# Patient Record
Sex: Female | Born: 1960
Health system: Southern US, Community
[De-identification: ages and names within clinical notes are randomized; demographics above are authoritative.]

## PROBLEM LIST (undated history)

## (undated) DIAGNOSIS — C50912 Malignant neoplasm of unspecified site of left female breast: Secondary | ICD-10-CM

## (undated) DIAGNOSIS — E119 Type 2 diabetes mellitus without complications: Secondary | ICD-10-CM

## (undated) DIAGNOSIS — K5792 Diverticulitis of intestine, part unspecified, without perforation or abscess without bleeding: Secondary | ICD-10-CM

## (undated) DIAGNOSIS — K635 Polyp of colon: Secondary | ICD-10-CM

## (undated) DIAGNOSIS — K659 Peritonitis, unspecified: Secondary | ICD-10-CM

## (undated) DIAGNOSIS — E785 Hyperlipidemia, unspecified: Secondary | ICD-10-CM

## (undated) DIAGNOSIS — Z8742 Personal history of other diseases of the female genital tract: Secondary | ICD-10-CM

## (undated) DIAGNOSIS — M1990 Unspecified osteoarthritis, unspecified site: Secondary | ICD-10-CM

## (undated) DIAGNOSIS — E039 Hypothyroidism, unspecified: Secondary | ICD-10-CM

## (undated) DIAGNOSIS — Z8619 Personal history of other infectious and parasitic diseases: Secondary | ICD-10-CM

## (undated) DIAGNOSIS — E0500 Thyrotoxicosis with diffuse goiter without thyrotoxic crisis or storm: Secondary | ICD-10-CM

## (undated) DIAGNOSIS — I10 Essential (primary) hypertension: Secondary | ICD-10-CM

## (undated) DIAGNOSIS — Z8719 Personal history of other diseases of the digestive system: Secondary | ICD-10-CM

## (undated) DIAGNOSIS — N9489 Other specified conditions associated with female genital organs and menstrual cycle: Secondary | ICD-10-CM

## (undated) HISTORY — DX: Personal history of other infectious and parasitic diseases: Z86.19

## (undated) HISTORY — PX: HERNIA REPAIR: SHX51

## (undated) HISTORY — DX: Personal history of other diseases of the digestive system: Z87.19

## (undated) HISTORY — PX: LAPAROSCOPIC SIGMOID COLECTOMY: SHX5928

## (undated) HISTORY — DX: Unspecified osteoarthritis, unspecified site: M19.90

## (undated) HISTORY — PX: TONSILLECTOMY: SUR1361

## (undated) HISTORY — DX: Personal history of other diseases of the female genital tract: Z87.42

## (undated) HISTORY — DX: Essential (primary) hypertension: I10

## (undated) HISTORY — DX: Hypothyroidism, unspecified: E03.9

## (undated) HISTORY — DX: Hyperlipidemia, unspecified: E78.5

## (undated) HISTORY — DX: Other specified conditions associated with female genital organs and menstrual cycle: N94.89

## (undated) HISTORY — DX: Peritonitis, unspecified: K65.9

## (undated) HISTORY — DX: Polyp of colon: K63.5

## (undated) HISTORY — DX: Diverticulitis of intestine, part unspecified, without perforation or abscess without bleeding: K57.92

---

## 2001-02-15 ENCOUNTER — Other Ambulatory Visit: Admission: RE | Admit: 2001-02-15 | Discharge: 2001-02-15 | Payer: Self-pay | Admitting: Internal Medicine

## 2002-02-06 ENCOUNTER — Emergency Department (HOSPITAL_COMMUNITY): Admission: EM | Admit: 2002-02-06 | Discharge: 2002-02-06 | Payer: Self-pay | Admitting: Emergency Medicine

## 2005-05-06 ENCOUNTER — Emergency Department (HOSPITAL_COMMUNITY): Admission: EM | Admit: 2005-05-06 | Discharge: 2005-05-06 | Payer: Self-pay | Admitting: Emergency Medicine

## 2006-02-28 ENCOUNTER — Encounter: Admission: RE | Admit: 2006-02-28 | Discharge: 2006-02-28 | Payer: Self-pay | Admitting: Gastroenterology

## 2006-10-17 HISTORY — PX: TOTAL KNEE ARTHROPLASTY: SHX125

## 2007-05-07 ENCOUNTER — Encounter: Payer: Self-pay | Admitting: Family Medicine

## 2007-07-13 ENCOUNTER — Inpatient Hospital Stay (HOSPITAL_COMMUNITY): Admission: RE | Admit: 2007-07-13 | Discharge: 2007-07-18 | Payer: Self-pay | Admitting: Orthopedic Surgery

## 2007-07-16 ENCOUNTER — Ambulatory Visit: Payer: Self-pay | Admitting: Physical Medicine & Rehabilitation

## 2008-03-14 ENCOUNTER — Encounter: Admission: RE | Admit: 2008-03-14 | Discharge: 2008-03-14 | Payer: Self-pay | Admitting: Gastroenterology

## 2009-06-03 ENCOUNTER — Encounter: Payer: Self-pay | Admitting: Family Medicine

## 2009-06-03 ENCOUNTER — Encounter (INDEPENDENT_AMBULATORY_CARE_PROVIDER_SITE_OTHER): Payer: Self-pay | Admitting: *Deleted

## 2009-06-03 LAB — CONVERTED CEMR LAB
ALT: 27 units/L
Albumin: 4.1 g/dL
BUN: 16 mg/dL
Calcium: 9.2 mg/dL
Cholesterol: 187 mg/dL
Creatinine, Ser: 0.62 mg/dL
Globulin: 3 g/dL
HCT: 36.1 %
HDL: 34 mg/dL
MCH: 26 pg
MCV: 77 fL
Potassium, serum: 4.2 mmol/L
Sodium, serum: 139 mmol/L
Triglycerides: 178 mg/dL
Vit D, 25-Hydroxy: 23.1 ng/mL

## 2009-06-29 ENCOUNTER — Ambulatory Visit: Payer: Self-pay | Admitting: Oncology

## 2009-06-30 ENCOUNTER — Ambulatory Visit: Admission: RE | Admit: 2009-06-30 | Discharge: 2009-09-23 | Payer: Self-pay | Admitting: Radiation Oncology

## 2009-07-13 ENCOUNTER — Ambulatory Visit: Payer: Self-pay | Admitting: Genetic Counselor

## 2009-07-15 ENCOUNTER — Encounter: Admission: RE | Admit: 2009-07-15 | Discharge: 2009-07-15 | Payer: Self-pay | Admitting: General Surgery

## 2009-07-17 HISTORY — PX: BREAST LUMPECTOMY WITH NEEDLE LOCALIZATION AND AXILLARY LYMPH NODE DISSECTION: SHX5758

## 2009-07-20 ENCOUNTER — Ambulatory Visit (HOSPITAL_BASED_OUTPATIENT_CLINIC_OR_DEPARTMENT_OTHER): Admission: RE | Admit: 2009-07-20 | Discharge: 2009-07-20 | Payer: Self-pay | Admitting: General Surgery

## 2009-07-20 ENCOUNTER — Encounter (INDEPENDENT_AMBULATORY_CARE_PROVIDER_SITE_OTHER): Payer: Self-pay | Admitting: General Surgery

## 2009-07-25 ENCOUNTER — Emergency Department (HOSPITAL_COMMUNITY): Admission: EM | Admit: 2009-07-25 | Discharge: 2009-07-25 | Payer: Self-pay | Admitting: Emergency Medicine

## 2009-07-29 ENCOUNTER — Ambulatory Visit: Payer: Self-pay | Admitting: Oncology

## 2009-07-29 LAB — CBC WITH DIFFERENTIAL/PLATELET
BASO%: 0.3 % (ref 0.0–2.0)
Basophils Absolute: 0 10*3/uL (ref 0.0–0.1)
Eosinophils Absolute: 0.2 10*3/uL (ref 0.0–0.5)
HCT: 35.4 % (ref 34.8–46.6)
HGB: 12.1 g/dL (ref 11.6–15.9)
LYMPH%: 21.6 % (ref 14.0–49.7)
MCHC: 34.1 g/dL (ref 31.5–36.0)
MONO#: 0.7 10*3/uL (ref 0.1–0.9)
NEUT%: 68.2 % (ref 38.4–76.8)
Platelets: 467 10*3/uL — ABNORMAL HIGH (ref 145–400)
WBC: 8.9 10*3/uL (ref 3.9–10.3)
lymph#: 1.9 10*3/uL (ref 0.9–3.3)

## 2009-07-30 LAB — COMPREHENSIVE METABOLIC PANEL
BUN: 14 mg/dL (ref 6–23)
CO2: 25 mEq/L (ref 19–32)
Calcium: 9.5 mg/dL (ref 8.4–10.5)
Chloride: 101 mEq/L (ref 96–112)
Creatinine, Ser: 0.72 mg/dL (ref 0.40–1.20)
Glucose, Bld: 169 mg/dL — ABNORMAL HIGH (ref 70–99)
Total Bilirubin: 0.4 mg/dL (ref 0.3–1.2)

## 2009-07-30 LAB — LACTATE DEHYDROGENASE: LDH: 160 U/L (ref 94–250)

## 2009-07-30 LAB — VITAMIN D 25 HYDROXY (VIT D DEFICIENCY, FRACTURES): Vit D, 25-Hydroxy: 45 ng/mL (ref 30–89)

## 2009-07-30 LAB — CANCER ANTIGEN 27.29: CA 27.29: 27 U/mL (ref 0–39)

## 2009-09-04 ENCOUNTER — Emergency Department (HOSPITAL_COMMUNITY): Admission: EM | Admit: 2009-09-04 | Discharge: 2009-09-04 | Payer: Self-pay | Admitting: Family Medicine

## 2009-09-23 ENCOUNTER — Ambulatory Visit: Admission: RE | Admit: 2009-09-23 | Discharge: 2009-10-14 | Payer: Self-pay | Admitting: Radiation Oncology

## 2009-10-17 HISTORY — PX: ABCESS DRAINAGE: SHX399

## 2009-10-19 ENCOUNTER — Inpatient Hospital Stay (HOSPITAL_COMMUNITY): Admission: EM | Admit: 2009-10-19 | Discharge: 2009-10-28 | Payer: Self-pay | Admitting: Emergency Medicine

## 2009-11-09 ENCOUNTER — Ambulatory Visit (HOSPITAL_COMMUNITY): Admission: RE | Admit: 2009-11-09 | Discharge: 2009-11-09 | Payer: Self-pay | Admitting: General Surgery

## 2009-12-09 ENCOUNTER — Inpatient Hospital Stay (HOSPITAL_COMMUNITY): Admission: EM | Admit: 2009-12-09 | Discharge: 2009-12-14 | Payer: Self-pay | Admitting: Emergency Medicine

## 2009-12-15 HISTORY — PX: LAPAROSCOPIC SIGMOID COLECTOMY: SHX5928

## 2009-12-25 ENCOUNTER — Ambulatory Visit: Payer: Self-pay | Admitting: Oncology

## 2009-12-29 LAB — CBC WITH DIFFERENTIAL/PLATELET
BASO%: 0.1 % (ref 0.0–2.0)
EOS%: 0.6 % (ref 0.0–7.0)
MCH: 28.6 pg (ref 25.1–34.0)
MCHC: 34.5 g/dL (ref 31.5–36.0)
MONO#: 0.5 10*3/uL (ref 0.1–0.9)
RDW: 18.1 % — ABNORMAL HIGH (ref 11.2–14.5)
WBC: 11.6 10*3/uL — ABNORMAL HIGH (ref 3.9–10.3)
lymph#: 1.2 10*3/uL (ref 0.9–3.3)

## 2009-12-29 LAB — COMPREHENSIVE METABOLIC PANEL
ALT: 15 U/L (ref 0–35)
AST: 17 U/L (ref 0–37)
Albumin: 3.6 g/dL (ref 3.5–5.2)
Calcium: 9 mg/dL (ref 8.4–10.5)
Chloride: 98 mEq/L (ref 96–112)
Potassium: 3.2 mEq/L — ABNORMAL LOW (ref 3.5–5.3)
Sodium: 135 mEq/L (ref 135–145)
Total Protein: 7.1 g/dL (ref 6.0–8.3)

## 2010-01-06 ENCOUNTER — Inpatient Hospital Stay (HOSPITAL_COMMUNITY): Admission: RE | Admit: 2010-01-06 | Discharge: 2010-01-11 | Payer: Self-pay | Admitting: General Surgery

## 2010-01-06 ENCOUNTER — Encounter (INDEPENDENT_AMBULATORY_CARE_PROVIDER_SITE_OTHER): Payer: Self-pay | Admitting: General Surgery

## 2010-05-07 DIAGNOSIS — N9489 Other specified conditions associated with female genital organs and menstrual cycle: Secondary | ICD-10-CM

## 2010-05-07 DIAGNOSIS — Z8742 Personal history of other diseases of the female genital tract: Secondary | ICD-10-CM | POA: Insufficient documentation

## 2010-05-07 HISTORY — DX: Other specified conditions associated with female genital organs and menstrual cycle: N94.89

## 2010-05-07 HISTORY — DX: Personal history of other diseases of the female genital tract: Z87.42

## 2010-05-08 HISTORY — PX: COMBINED HYSTEROSCOPY DIAGNOSTIC / D&C: SUR297

## 2010-05-11 ENCOUNTER — Ambulatory Visit: Payer: Self-pay | Admitting: Family Medicine

## 2010-05-11 DIAGNOSIS — Z8719 Personal history of other diseases of the digestive system: Secondary | ICD-10-CM | POA: Insufficient documentation

## 2010-05-11 DIAGNOSIS — E039 Hypothyroidism, unspecified: Secondary | ICD-10-CM | POA: Insufficient documentation

## 2010-05-11 DIAGNOSIS — Z853 Personal history of malignant neoplasm of breast: Secondary | ICD-10-CM | POA: Insufficient documentation

## 2010-05-11 DIAGNOSIS — E785 Hyperlipidemia, unspecified: Secondary | ICD-10-CM

## 2010-05-11 DIAGNOSIS — E1169 Type 2 diabetes mellitus with other specified complication: Secondary | ICD-10-CM | POA: Insufficient documentation

## 2010-05-11 DIAGNOSIS — I10 Essential (primary) hypertension: Secondary | ICD-10-CM | POA: Insufficient documentation

## 2010-05-11 DIAGNOSIS — E119 Type 2 diabetes mellitus without complications: Secondary | ICD-10-CM | POA: Insufficient documentation

## 2010-05-11 DIAGNOSIS — Z860100 Personal history of colon polyps, unspecified: Secondary | ICD-10-CM | POA: Insufficient documentation

## 2010-05-14 ENCOUNTER — Ambulatory Visit (HOSPITAL_COMMUNITY): Admission: RE | Admit: 2010-05-14 | Discharge: 2010-05-14 | Payer: Self-pay | Admitting: Obstetrics and Gynecology

## 2010-05-14 HISTORY — PX: POLYPECTOMY: SHX149

## 2010-05-24 ENCOUNTER — Encounter (INDEPENDENT_AMBULATORY_CARE_PROVIDER_SITE_OTHER): Payer: Self-pay | Admitting: *Deleted

## 2010-06-09 ENCOUNTER — Ambulatory Visit: Payer: Self-pay | Admitting: Family Medicine

## 2010-06-10 LAB — CONVERTED CEMR LAB
ALT: 17 units/L (ref 0–35)
AST: 18 units/L (ref 0–37)
Alkaline Phosphatase: 47 units/L (ref 39–117)
Basophils Absolute: 0 10*3/uL (ref 0.0–0.1)
Basophils Relative: 0.3 % (ref 0.0–3.0)
Bilirubin, Direct: 0.1 mg/dL (ref 0.0–0.3)
CO2: 28 meq/L (ref 19–32)
Calcium: 9.2 mg/dL (ref 8.4–10.5)
Cholesterol: 172 mg/dL (ref 0–200)
Creatinine, Ser: 0.5 mg/dL (ref 0.4–1.2)
Eosinophils Absolute: 0.1 10*3/uL (ref 0.0–0.7)
GFR calc non Af Amer: 129.41 mL/min (ref >60)
Hgb A1c MFr Bld: 6.3 % (ref 4.6–6.5)
Lymphocytes Relative: 25.3 % (ref 12.0–46.0)
MCHC: 33.9 g/dL (ref 30.0–36.0)
MCV: 86.1 fL (ref 78.0–100.0)
Monocytes Absolute: 0.5 10*3/uL (ref 0.1–1.0)
Neutrophils Relative %: 64.3 % (ref 43.0–77.0)
Platelets: 340 10*3/uL (ref 150.0–400.0)
RBC: 4.12 M/uL (ref 3.87–5.11)
RDW: 14.6 % (ref 11.5–14.6)
Sodium: 141 meq/L (ref 135–145)
TSH: 0.03 microintl units/mL — ABNORMAL LOW (ref 0.35–5.50)
Total Bilirubin: 0.4 mg/dL (ref 0.3–1.2)
Total Protein: 6.4 g/dL (ref 6.0–8.3)

## 2010-06-11 LAB — CONVERTED CEMR LAB: Free T4: 1.51 ng/dL (ref 0.60–1.60)

## 2010-06-14 ENCOUNTER — Telehealth (INDEPENDENT_AMBULATORY_CARE_PROVIDER_SITE_OTHER): Payer: Self-pay | Admitting: *Deleted

## 2010-06-30 ENCOUNTER — Encounter: Payer: Self-pay | Admitting: Family Medicine

## 2010-07-03 ENCOUNTER — Encounter (INDEPENDENT_AMBULATORY_CARE_PROVIDER_SITE_OTHER): Payer: Self-pay | Admitting: Surgery

## 2010-07-03 ENCOUNTER — Inpatient Hospital Stay (HOSPITAL_COMMUNITY): Admission: EM | Admit: 2010-07-03 | Discharge: 2010-07-13 | Payer: Self-pay | Admitting: Emergency Medicine

## 2010-07-03 DIAGNOSIS — Z8719 Personal history of other diseases of the digestive system: Secondary | ICD-10-CM

## 2010-07-03 DIAGNOSIS — K659 Peritonitis, unspecified: Secondary | ICD-10-CM | POA: Insufficient documentation

## 2010-07-03 HISTORY — PX: APPENDECTOMY: SHX54

## 2010-07-03 HISTORY — DX: Peritonitis, unspecified: K65.9

## 2010-07-03 HISTORY — DX: Personal history of other diseases of the digestive system: Z87.19

## 2010-07-03 HISTORY — PX: COLECTOMY WITH COLOSTOMY CREATION/HARTMANN PROCEDURE: SHX6598

## 2010-07-22 ENCOUNTER — Ambulatory Visit: Payer: Self-pay | Admitting: Oncology

## 2010-07-26 LAB — COMPREHENSIVE METABOLIC PANEL
ALT: 11 U/L (ref 0–35)
CO2: 24 mEq/L (ref 19–32)
Calcium: 9.5 mg/dL (ref 8.4–10.5)
Chloride: 104 mEq/L (ref 96–112)
Creatinine, Ser: 0.58 mg/dL (ref 0.40–1.20)
Glucose, Bld: 102 mg/dL — ABNORMAL HIGH (ref 70–99)
Sodium: 140 mEq/L (ref 135–145)
Total Protein: 7.4 g/dL (ref 6.0–8.3)

## 2010-07-26 LAB — CBC WITH DIFFERENTIAL/PLATELET
BASO%: 0.6 % (ref 0.0–2.0)
Eosinophils Absolute: 0.3 10*3/uL (ref 0.0–0.5)
HCT: 33 % — ABNORMAL LOW (ref 34.8–46.6)
HGB: 11.3 g/dL — ABNORMAL LOW (ref 11.6–15.9)
MCHC: 34.3 g/dL (ref 31.5–36.0)
MONO#: 0.5 10*3/uL (ref 0.1–0.9)
NEUT#: 4.9 10*3/uL (ref 1.5–6.5)
NEUT%: 63.1 % (ref 38.4–76.8)
Platelets: 487 10*3/uL — ABNORMAL HIGH (ref 145–400)
WBC: 7.7 10*3/uL (ref 3.9–10.3)
lymph#: 2 10*3/uL (ref 0.9–3.3)

## 2010-07-26 LAB — FERRITIN: Ferritin: 32 ng/mL (ref 10–291)

## 2010-08-02 ENCOUNTER — Encounter: Payer: Self-pay | Admitting: Family Medicine

## 2010-08-03 ENCOUNTER — Encounter (INDEPENDENT_AMBULATORY_CARE_PROVIDER_SITE_OTHER): Payer: Self-pay | Admitting: *Deleted

## 2010-08-03 ENCOUNTER — Encounter: Payer: Self-pay | Admitting: Family Medicine

## 2010-08-05 ENCOUNTER — Encounter: Payer: Self-pay | Admitting: Family Medicine

## 2010-08-09 ENCOUNTER — Encounter (INDEPENDENT_AMBULATORY_CARE_PROVIDER_SITE_OTHER): Payer: Self-pay | Admitting: *Deleted

## 2010-08-10 ENCOUNTER — Encounter: Payer: Self-pay | Admitting: Family Medicine

## 2010-08-16 ENCOUNTER — Encounter (INDEPENDENT_AMBULATORY_CARE_PROVIDER_SITE_OTHER): Payer: Self-pay | Admitting: *Deleted

## 2010-09-15 ENCOUNTER — Encounter: Admission: RE | Admit: 2010-09-15 | Discharge: 2010-09-15 | Payer: Self-pay | Admitting: Internal Medicine

## 2010-09-16 HISTORY — PX: COLOSTOMY TAKEDOWN: SHX5783

## 2010-09-24 ENCOUNTER — Encounter (INDEPENDENT_AMBULATORY_CARE_PROVIDER_SITE_OTHER): Payer: Self-pay | Admitting: General Surgery

## 2010-09-24 ENCOUNTER — Inpatient Hospital Stay (HOSPITAL_COMMUNITY)
Admission: RE | Admit: 2010-09-24 | Discharge: 2010-10-04 | Payer: Self-pay | Source: Home / Self Care | Attending: General Surgery | Admitting: General Surgery

## 2010-10-08 ENCOUNTER — Encounter: Payer: Self-pay | Admitting: Family Medicine

## 2010-11-16 NOTE — Letter (Signed)
Summary: [facility]  [facility]   Imported By: Lanelle Bal 06/07/2010 11:56:23  _____________________________________________________________________  External Attachment:    Type:   Image     Comment:   External Document

## 2010-11-16 NOTE — Consult Note (Signed)
Summary: [facility]  [facility]   Imported By: Lanelle Bal 08/17/2010 11:28:17  _____________________________________________________________________  External Attachment:    Type:   Image     Comment:   External Document

## 2010-11-16 NOTE — Miscellaneous (Signed)
Summary: lab from Dr. Lucianne Muss  Clinical Lists Changes  Observations: Added new observation of VIT D 25-OH: 20 ng/mL (08/03/2010 12:01)

## 2010-11-16 NOTE — Progress Notes (Signed)
Summary: labs  Phone Note Outgoing Call   Call placed by: Doristine Devoid CMA,  June 14, 2010 1:36 PM Call placed to: Patient Summary of Call: TSH is low.  LDL is 107- goal w/ diabetes is 70.  should increase lipitor to 40mg .  remainder of labs look good.  if pt gives [place] name of endocrinologist we will fax labs to them   Follow-up for Phone Call        left message on machine ..........Marland KitchenDoristine Devoid CMA  June 14, 2010 1:36 PM   Additional Follow-up for Phone Call Additional follow up Details #1::        Patient is aware.   Dr. Lucianne Muss with [facility].  Labs faxed. Additional Follow-up by: Harold Barban,  June 18, 2010 10:24 AM    New/Updated Medications: LIPITOR 40 MG TABS (ATORVASTATIN CALCIUM) take one tablet at bedtime

## 2010-11-16 NOTE — Assessment & Plan Note (Signed)
Summary: New patient to establish/NTA   Vital Signs:  Patient profile:   50 year old female Height:      68 inches Weight:      214 pounds BMI:     32.66 Pulse rate:   88 / minute BP sitting:   120 / 80  (left arm)  Vitals Entered By: Doristine Devoid CMA (May 11, 2010 8:17 AM) CC: New Est- refill on meds and needs new physician   History of Present Illness: 50 yo woman here today to establish care.  Previous MD- Renae Gloss.  GYNSu Hilt  HTN- well controlled on current med (Lisinopril).  denies CP, SOB, HAs, visual changes, N/V, edema.  DM- controlled on diet and exercise at this time.  Hyperlipidemia- tolerating Lipitor w/out difficulty.  no abd pain, N/V, myalgias  Breast CA- currently on Tamoxifen.  s/p radiation.  follows w/ Dr [place] Bells- currently maintained on Synthroid .  asymptomatic.  Diverticulitis- s/p partial colectomy for recurrent infxn.  GI- [facility]  Preventive Screening-Counseling & Management  Alcohol-Tobacco     Alcohol drinks/day: <1     Smoking Status: quit > 6 months      Drug Use:  never.    Current Medications (verified): 1)  Lisinopril 10 Mg Tabs (Lisinopril) .... Take One Tablet Daily 2)  Synthroid 200 Mcg Tabs (Levothyroxine Sodium) .... Take One Tablet Daily 3)  Tamoxifen Citrate 20 Mg Tabs (Tamoxifen Citrate) .... Take One Tablet Daily 4)  Lipitor 20 Mg Tabs (Atorvastatin Calcium) .... Take One Tablet Daily 5)  Align  Caps (Probiotic Product) .... Take One Tablet Daily  Allergies (verified): 1)  ! Penicillin 2)  ! Amoxicillin  Past History:  Past Medical History: Arthritis Breast cancer, hx of- Left hx of Chicken Pox Diverticulitis, hx of Hyperlipidemia Migraines Hypothyroidism Colonic polyps, hx of  Past Surgical History: Bilateral Knee Replacement Lumpectomy-L Colectomy-partial 2' diverticulitis Tonsillectomy  Past History:  Care Management: Gastroenterology: Dr. Loreta Ave Hematology/Oncology: Dr. Darnelle Catalan and Dr.  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 No apparent alopecia or balding. Neck:  No deformities, masses, or tenderness noted. Lungs:  Normal respiratory effort, chest expands symmetrically. Lungs are clear to auscultation, no crackles or wheezes. Heart:  Normal rate and regular rhythm. S1 and S2 normal without gallop, murmur, click, rub or other extra sounds. Abdomen:  soft, NT/ND, +BS Pulses:  +2 carotid, radial, DP Extremities:  no C/C/E Neurologic:  alert & oriented X3, cranial nerves II-XII intact, and gait normal.   Psych:  Cognition and judgment appear intact. Alert and cooperative with normal attention span and concentration. No apparent delusions, illusions, hallucinations   Impression & Recommendations:  Problem # 1:  HYPERTENSION, BENIGN ESSENTIAL (ICD-401.1) Assessment New well controlled.  asyptomatic.  continue meds Her updated medication list for this problem includes:    Lisinopril 10 Mg Tabs (Lisinopril) .Marland Kitchen... Take one tablet daily  Problem # 2:  DIABETES-TYPE 2 (ICD-250.00) Assessment: New controlled w/ diet and exercise per pt report.  will need labs to assess.  pt not fasting. Her updated medication list for this  problem includes:    Lisinopril 10 Mg Tabs (Lisinopril) .Marland Kitchen... Take one tablet daily  Problem # 3:  HYPERLIPIDEMIA (ICD-272.4) Assessment: New will need to check labs to assess level of control.  pt not  fasting. Her updated medication list for this problem includes:    Lipitor 20 Mg Tabs (Atorvastatin calcium) .Marland Kitchen... Take one tablet daily  Problem # 4:  BREAST CANCER, HX OF (ICD-V10.3) Assessment: New on Tamoxifen.  following w/ Dr Darnelle Catalan.  s/p radiation.  Problem # 5:  HYPOTHYROIDISM (ICD-244.9) Assessment: New pt w/ Grave's dz.  will check labs at pt's upcoming CPE and adjust meds as needed. Her updated medication list for this problem includes:    Synthroid 200 Mcg Tabs (Levothyroxine sodium) .Marland Kitchen... Take one tablet daily  Problem # 6:  DIVERTICULITIS, HX OF (ICD-V12.79) Assessment: New pt s/p colectomy for recurrent infxn.  follows regularly w/ Dr Loreta Ave.  Complete Medication List: 1)  Lisinopril 10 Mg Tabs (Lisinopril) .... Take one tablet daily 2)  Synthroid 200 Mcg Tabs (Levothyroxine sodium) .... Take one tablet daily 3)  Tamoxifen Citrate 20 Mg Tabs (Tamoxifen citrate) .... Take one tablet daily 4)  Lipitor 20 Mg Tabs (Atorvastatin calcium) .... Take one tablet daily 5)  Align Caps (Probiotic product) .... Take one tablet daily  Patient Instructions: 1)  Please schedule your complete physical for August- do not eat before this appt 2)  GOOD LUCK at GYN!!! 3)  Call with any questions or concerns 4)  Welcome!  We're glad to have you! Prescriptions: LIPITOR 20 MG TABS (ATORVASTATIN CALCIUM) take one tablet daily  #90 x 3   Entered and Authorized by:   Neena Rhymes MD   Signed by:   Neena Rhymes MD on 05/11/2010   Method used:   Electronically to        [facility] [address] [facility] [address] [phone]* (retail)       [address]       [place], [place]  [address]       Ph: [phone] or [phone]       Fax: [phone]   RxID:   [id number]    Preventive Care Screening  Pap Smear:    Date:  03/17/2010    Results:  normal   Mammogram:    Date:  06/17/2009    Results:  abnormal left   Last Tetanus Booster:    Date:  10/17/2005    Results:   Historical

## 2010-11-16 NOTE — Letter (Signed)
Summary: [facility]  [facility]   Imported By: Lanelle Bal 08/11/2010 10:00:34  _____________________________________________________________________  External Attachment:    Type:   Image     Comment:   External Document

## 2010-11-16 NOTE — Miscellaneous (Signed)
  Clinical Lists Changes  Observations: Added new observation of VIT D 25-OH: 23.1 ng/mL (06/03/2009 10:11) Added new observation of TRIGLYC TOT: 178 mg/dL (16/07/9603 54:09) Added new observation of LDL: 117 mg/dL (81/19/1478 29:56) Added new observation of HDL: 34 mg/dL (21/30/8657 84:69) Added new observation of CHOLESTEROL: 187 mg/dL (62/95/2841 32:44) Added new observation of BILI TOTAL: 0.2 mg/dL (10/19/7251 66:44) Added new observation of ALK PHOS: 84 units/L (06/03/2009 10:11) Added new observation of SGPT (ALT): 27 units/L (06/03/2009 10:11) Added new observation of SGOT (AST): 21 units/L (06/03/2009 10:11) Added new observation of PROTEIN, TOT: 7.1 g/dL (03/47/4259 56:38) Added new observation of GLOBULIN TOT: 3.0 g/dL (75/64/3329 51:88) Added new observation of ALBUMIN: 4.1 g/dL (41/66/0630 16:01) Added new observation of CALCIUM: 9.2 mg/dL (09/32/3557 32:20) Added new observation of GLUCOSE SER: 123 mg/dL (25/42/7062 37:62) Added new observation of CREATININE: 0.62 mg/dL (83/15/1761 60:73) Added new observation of BUN: 16 mg/dL (71/03/2693 85:46) Added new observation of CO2 TOTAL: 18 mmol/L (06/03/2009 10:11) Added new observation of CHLORIDE: 100 mmol/L (06/03/2009 10:11) Added new observation of POTASSIUM: 4.2 mmol/L (06/03/2009 10:11) Added new observation of SODIUM: 139 mmol/L (06/03/2009 10:11) Added new observation of PLATELETS: 432 10*3/mm3 (06/03/2009 10:11) Added new observation of MCH: 26.0 pg (06/03/2009 10:11) Added new observation of MCV: 77 fL (06/03/2009 10:11) Added new observation of HCT: 36.1 % (06/03/2009 10:11) Added new observation of HGB: 12.2 g/dL (27/12/5007 38:18) Added new observation of RBC: 4.69 10*6/mm3 (06/03/2009 10:11) Added new observation of WBC: 8.8 10*3/mm3 (06/03/2009 10:11)

## 2010-11-16 NOTE — Procedures (Signed)
Summary: Colonoscopy/[facility]  Colonoscopy/[facility]   Imported By: Lanelle Bal 08/24/2010 11:33:37  _____________________________________________________________________  External Attachment:    Type:   Image     Comment:   External Document

## 2010-11-16 NOTE — Letter (Signed)
Summary: [facility]  [facility]   Imported By: Lanelle Bal 06/07/2010 11:57:12  _____________________________________________________________________  External Attachment:    Type:   Image     Comment:   External Document

## 2010-11-16 NOTE — Assessment & Plan Note (Signed)
Summary: CPX/FASTING//KN   Vital Signs:  Patient profile:   50 year old female Height:      68 inches Weight:      217 pounds BMI:     33.11 Pulse rate:   88 / minute BP sitting:   120 / 84  (left arm)  Vitals Entered By: Doristine Devoid CMA (June 09, 2010 8:11 AM) CC: CPX AND LABS   History of Present Illness: 50 yo woman here today for CPE.  no concerns about her health.  UTD on GYN, mammogram, colonoscopy (has 1 scheduled for next month).  Preventive Screening-Counseling & Management  Alcohol-Tobacco     Alcohol drinks/day: <1     Smoking Status: quit  Caffeine-Diet-Exercise     Does Patient Exercise: yes     Type of exercise: swimming, cycling.      Drug Use:  never.    Problems Prior to Update: 1)  Physical Examination  (ICD-V70.0) 2)  Diabetes-type 2  (ICD-250.00) 3)  Hypertension, Benign Essential  (ICD-401.1) 4)  Colonic Polyps, Hx of  (ICD-V12.72) 5)  Hypothyroidism  (ICD-244.9) 6)  Hyperlipidemia  (ICD-272.4) 7)  Diverticulitis, Hx of  (ICD-V12.79) 8)  Breast Cancer, Hx of  (ICD-V10.3)  Current Medications (verified): 1)  Lisinopril 10 Mg Tabs (Lisinopril) .... Take One Tablet Daily 2)  Synthroid 200 Mcg Tabs (Levothyroxine Sodium) .... Take One Tablet Daily 3)  Tamoxifen Citrate 20 Mg Tabs (Tamoxifen Citrate) .... Take One Tablet Daily 4)  Lipitor 20 Mg Tabs (Atorvastatin Calcium) .... Take One Tablet Daily 5)  Align  Caps (Probiotic Product) .... Take One Tablet Daily  Allergies (verified): 1)  ! Penicillin 2)  ! 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    Physical Exam  General:  Well-developed,well-nourished,in no acute distress; alert,appropriate and cooperative throughout examination Head:  Normocephalic and atraumatic without obvious abnormalities. No apparent alopecia or balding. Eyes:  No corneal or conjunctival inflammation noted. EOMI. Perrla. Funduscopic exam benign, without hemorrhages, exudates or papilledema. Vision grossly normal. Ears:  External ear exam shows no significant lesions or deformities.  Otoscopic examination reveals clear canals, tympanic membranes are intact bilaterally without bulging, retraction, inflammation or discharge. Hearing is grossly normal bilaterally. Nose:  External nasal examination shows no deformity or inflammation. Nasal mucosa are pink and moist without lesions or  exudates. Mouth:  Oral mucosa and oropharynx without lesions or exudates.  Teeth in good repair. Neck:  No deformities, masses, or tenderness noted. Breasts:  deferred to GYN Lungs:  Normal respiratory effort, chest expands symmetrically. Lungs are clear to auscultation, no crackles or wheezes. Heart:  Normal rate and regular rhythm. S1 and S2 normal without gallop, murmur, click, rub or other extra sounds. Abdomen:  soft, NT/ND, +BS Genitalia:  deferred to GYN Pulses:  +2 carotid, radial, DP Extremities:  no C/C/E Neurologic:  alert & oriented X3, cranial nerves II-XII intact, and gait normal.   Skin:  Intact without suspicious lesions or rashes Cervical Nodes:  No lymphadenopathy noted Axillary Nodes:  No palpable lymphadenopathy Psych:  Cognition and judgment appear intact. Alert and cooperative with normal attention span and concentration. No apparent delusions, illusions, hallucinations   Impression & Recommendations:  Problem # 1:  PHYSICAL EXAMINATION (ICD-V70.0) Assessment New pt's PE WNL.  UTD on health maintainence.  anticipatory guidance provided. Orders: T-Vitamin D (25-Hydroxy) [phone]) Specimen Handling (09811)  Complete Medication List: 1)  Lisinopril 10 Mg Tabs (Lisinopril) .... Take one tablet daily 2)  Synthroid 200 Mcg Tabs (Levothyroxine sodium) .... Take one tablet daily 3)  Tamoxifen Citrate 20 Mg Tabs (Tamoxifen citrate) .... Take one tablet daily 4)  Lipitor 20 Mg Tabs (Atorvastatin calcium) .... Take one tablet daily 5)  Align Caps (Probiotic product) .... Take one tablet daily  Other Orders: Venipuncture (91478) TLB-BMP (Basic Metabolic Panel-BMET) (80048-METABOL) TLB-A1C / Hgb A1C (Glycohemoglobin) (83036-A1C) TLB-TSH (Thyroid Stimulating Hormone) (84443-TSH) TLB-Lipid Panel (80061-LIPID) TLB-Hepatic/Liver Function Pnl (80076-HEPATIC) TLB-CBC Platelet - w/Differential (85025-CBCD)  Patient Instructions: 1)  Please schedule a follow-up  appointment in 6 months to recheck cholesterol and BP 2)  Keep up the good work on diet and exercise- you look great!! 3)  Call with any questions or concerns 4)  We'll notify you of your lab results 5)  Have a great labor day weekend!

## 2010-11-16 NOTE — Miscellaneous (Signed)
  Clinical Lists Changes  Observations: Added new observation of COLONOSCOPY: normal (08/10/2010 11:12)      Preventive Care Screening  Colonoscopy:    Date:  08/10/2010    Results:  normal

## 2010-11-18 ENCOUNTER — Encounter: Payer: Self-pay | Admitting: Family Medicine

## 2010-11-18 NOTE — Letter (Signed)
Summary: [facility]   Imported By: Lanelle Bal 11/09/2010 12:52:58  _____________________________________________________________________  External Attachment:    Type:   Image     Comment:   External Document

## 2010-11-18 NOTE — Letter (Signed)
Summary: [facility]   Imported By: Lanelle Bal 09/27/2010 11:56:19  _____________________________________________________________________  External Attachment:    Type:   Image     Comment:   External Document

## 2010-12-08 NOTE — Letter (Signed)
Summary: [facility]   Imported By: Maryln Gottron 12/02/2010 09:07:57  _____________________________________________________________________  External Attachment:    Type:   Image     Comment:   External Document

## 2010-12-13 ENCOUNTER — Encounter (INDEPENDENT_AMBULATORY_CARE_PROVIDER_SITE_OTHER): Payer: Self-pay | Admitting: *Deleted

## 2010-12-13 ENCOUNTER — Ambulatory Visit: Payer: Self-pay | Admitting: Family Medicine

## 2010-12-23 NOTE — Letter (Signed)
Summary: [place] No Show Letter  [place] [facility] [place]  [address] [place], [place] [address]   Phone: [phone]  Fax: [phone]    12/13/2010 MRN: [id number]  [facility] [address] [place], [place]  [address]   Dear Ms. Strege,   Our records indicate that you missed your scheduled appointment with _____Dr.Tabori__________ on _____2/27/12____.  Please contact this office to reschedule your appointment as soon as possible.  It is important that you keep your scheduled appointments with your physician, so we can provide you the best care possible.  Please be advised that there may be a charge for "no show" appointments.    Sincerely,   [employer] [facility] [employer]

## 2010-12-27 LAB — GLUCOSE, CAPILLARY
Glucose-Capillary: 101 mg/dL — ABNORMAL HIGH (ref 70–99)
Glucose-Capillary: 111 mg/dL — ABNORMAL HIGH (ref 70–99)
Glucose-Capillary: 116 mg/dL — ABNORMAL HIGH (ref 70–99)
Glucose-Capillary: 134 mg/dL — ABNORMAL HIGH (ref 70–99)
Glucose-Capillary: 148 mg/dL — ABNORMAL HIGH (ref 70–99)
Glucose-Capillary: 150 mg/dL — ABNORMAL HIGH (ref 70–99)

## 2010-12-27 LAB — CBC
HCT: 21.2 % — ABNORMAL LOW (ref 36.0–46.0)
HCT: 22.4 % — ABNORMAL LOW (ref 36.0–46.0)
Hemoglobin: 6.4 g/dL — CL (ref 12.0–15.0)
MCH: 25.8 pg — ABNORMAL LOW (ref 26.0–34.0)
MCHC: 30.2 g/dL (ref 30.0–36.0)
MCV: 85.6 fL (ref 78.0–100.0)
Platelets: 387 10*3/uL (ref 150–400)
RBC: 2.43 MIL/uL — ABNORMAL LOW (ref 3.87–5.11)
RBC: 2.48 MIL/uL — ABNORMAL LOW (ref 3.87–5.11)
RDW: 14.3 % (ref 11.5–15.5)
WBC: 9 10*3/uL (ref 4.0–10.5)
WBC: 9.2 10*3/uL (ref 4.0–10.5)

## 2010-12-27 LAB — BASIC METABOLIC PANEL
BUN: 6 mg/dL (ref 6–23)
Calcium: 7.9 mg/dL — ABNORMAL LOW (ref 8.4–10.5)
GFR calc non Af Amer: >60 mL/min (ref >60)
Potassium: 4.1 mEq/L (ref 3.5–5.1)
Sodium: 139 mEq/L (ref 135–145)

## 2010-12-28 LAB — BASIC METABOLIC PANEL
BUN: 19 mg/dL (ref 6–23)
CO2: 25 mEq/L (ref 19–32)
CO2: 26 mEq/L (ref 19–32)
Chloride: 103 mEq/L (ref 96–112)
Chloride: 108 mEq/L (ref 96–112)
Creatinine, Ser: 0.57 mg/dL (ref 0.4–1.2)
Glucose, Bld: 158 mg/dL — ABNORMAL HIGH (ref 70–99)
Potassium: 4.2 mEq/L (ref 3.5–5.1)
Potassium: 4.7 mEq/L (ref 3.5–5.1)
Sodium: 141 mEq/L (ref 135–145)

## 2010-12-28 LAB — GLUCOSE, CAPILLARY
Glucose-Capillary: 107 mg/dL — ABNORMAL HIGH (ref 70–99)
Glucose-Capillary: 107 mg/dL — ABNORMAL HIGH (ref 70–99)
Glucose-Capillary: 109 mg/dL — ABNORMAL HIGH (ref 70–99)
Glucose-Capillary: 110 mg/dL — ABNORMAL HIGH (ref 70–99)
Glucose-Capillary: 114 mg/dL — ABNORMAL HIGH (ref 70–99)
Glucose-Capillary: 117 mg/dL — ABNORMAL HIGH (ref 70–99)
Glucose-Capillary: 121 mg/dL — ABNORMAL HIGH (ref 70–99)
Glucose-Capillary: 122 mg/dL — ABNORMAL HIGH (ref 70–99)
Glucose-Capillary: 122 mg/dL — ABNORMAL HIGH (ref 70–99)
Glucose-Capillary: 131 mg/dL — ABNORMAL HIGH (ref 70–99)
Glucose-Capillary: 136 mg/dL — ABNORMAL HIGH (ref 70–99)
Glucose-Capillary: 139 mg/dL — ABNORMAL HIGH (ref 70–99)
Glucose-Capillary: 142 mg/dL — ABNORMAL HIGH (ref 70–99)

## 2010-12-28 LAB — CBC
HCT: 28.4 % — ABNORMAL LOW (ref 36.0–46.0)
HCT: 33.7 % — ABNORMAL LOW (ref 36.0–46.0)
Hemoglobin: 8.9 g/dL — ABNORMAL LOW (ref 12.0–15.0)
MCH: 26.3 pg (ref 26.0–34.0)
MCH: 26.5 pg (ref 26.0–34.0)
MCHC: 31.3 g/dL (ref 30.0–36.0)
MCV: 81.2 fL (ref 78.0–100.0)
Platelets: 364 10*3/uL (ref 150–400)
RBC: 4.15 MIL/uL (ref 3.87–5.11)
RDW: 13.4 % (ref 11.5–15.5)

## 2010-12-28 LAB — URINE CULTURE
Culture  Setup Time: 201112091632
Culture: NO GROWTH
Special Requests: NEGATIVE

## 2010-12-28 LAB — DIFFERENTIAL
Eosinophils Absolute: 0.1 10*3/uL (ref 0.0–0.7)
Eosinophils Relative: 2 % (ref 0–5)
Lymphocytes Relative: 28 % (ref 12–46)
Lymphs Abs: 1.8 10*3/uL (ref 0.7–4.0)
Monocytes Absolute: 0.4 10*3/uL (ref 0.1–1.0)

## 2010-12-28 LAB — SURGICAL PCR SCREEN: Staphylococcus aureus: POSITIVE — AB

## 2010-12-30 LAB — COMPREHENSIVE METABOLIC PANEL
ALT: 10 U/L (ref 0–35)
ALT: 13 U/L (ref 0–35)
ALT: 19 U/L (ref 0–35)
AST: 14 U/L (ref 0–37)
AST: 15 U/L (ref 0–37)
AST: 18 U/L (ref 0–37)
AST: 24 U/L (ref 0–37)
Albumin: 2.2 g/dL — ABNORMAL LOW (ref 3.5–5.2)
Albumin: 2.3 g/dL — ABNORMAL LOW (ref 3.5–5.2)
Alkaline Phosphatase: 35 U/L — ABNORMAL LOW (ref 39–117)
Alkaline Phosphatase: 36 U/L — ABNORMAL LOW (ref 39–117)
BUN: 3 mg/dL — ABNORMAL LOW (ref 6–23)
BUN: 5 mg/dL — ABNORMAL LOW (ref 6–23)
BUN: 8 mg/dL (ref 6–23)
CO2: 24 mEq/L (ref 19–32)
CO2: 28 mEq/L (ref 19–32)
Calcium: 7.9 mg/dL — ABNORMAL LOW (ref 8.4–10.5)
Calcium: 8 mg/dL — ABNORMAL LOW (ref 8.4–10.5)
Calcium: 8.2 mg/dL — ABNORMAL LOW (ref 8.4–10.5)
Calcium: 9.2 mg/dL (ref 8.4–10.5)
Chloride: 102 mEq/L (ref 96–112)
Chloride: 103 mEq/L (ref 96–112)
Creatinine, Ser: 0.46 mg/dL (ref 0.4–1.2)
Creatinine, Ser: 0.58 mg/dL (ref 0.4–1.2)
GFR calc Af Amer: >60 mL/min (ref >60)
GFR calc Af Amer: >60 mL/min (ref >60)
GFR calc non Af Amer: >60 mL/min (ref >60)
GFR calc non Af Amer: >60 mL/min (ref >60)
Glucose, Bld: 110 mg/dL — ABNORMAL HIGH (ref 70–99)
Glucose, Bld: 156 mg/dL — ABNORMAL HIGH (ref 70–99)
Potassium: 3.5 mEq/L (ref 3.5–5.1)
Sodium: 137 mEq/L (ref 135–145)
Sodium: 140 mEq/L (ref 135–145)
Total Bilirubin: 0.2 mg/dL — ABNORMAL LOW (ref 0.3–1.2)
Total Bilirubin: 0.3 mg/dL (ref 0.3–1.2)
Total Protein: 5 g/dL — ABNORMAL LOW (ref 6.0–8.3)
Total Protein: 5.1 g/dL — ABNORMAL LOW (ref 6.0–8.3)
Total Protein: 7.1 g/dL (ref 6.0–8.3)

## 2010-12-30 LAB — URINE MICROSCOPIC-ADD ON

## 2010-12-30 LAB — DIFFERENTIAL
Eosinophils Absolute: 0 10*3/uL (ref 0.0–0.7)
Eosinophils Relative: 0 % (ref 0–5)
Lymphs Abs: 1.3 10*3/uL (ref 0.7–4.0)
Monocytes Absolute: 0.7 10*3/uL (ref 0.1–1.0)
Monocytes Relative: 6 % (ref 3–12)

## 2010-12-30 LAB — CBC
HCT: 24.4 % — ABNORMAL LOW (ref 36.0–46.0)
HCT: 24.6 % — ABNORMAL LOW (ref 36.0–46.0)
HCT: 29.1 % — ABNORMAL LOW (ref 36.0–46.0)
Hemoglobin: 12 g/dL (ref 12.0–15.0)
Hemoglobin: 8.4 g/dL — ABNORMAL LOW (ref 12.0–15.0)
MCH: 29.4 pg (ref 26.0–34.0)
MCH: 29.9 pg (ref 26.0–34.0)
MCHC: 34 g/dL (ref 30.0–36.0)
MCHC: 34.3 g/dL (ref 30.0–36.0)
MCHC: 34.3 g/dL (ref 30.0–36.0)
MCHC: 34.3 g/dL (ref 30.0–36.0)
MCV: 86.2 fL (ref 78.0–100.0)
MCV: 86.7 fL (ref 78.0–100.0)
Platelets: 219 10*3/uL (ref 150–400)
Platelets: 222 10*3/uL (ref 150–400)
RBC: 2.76 MIL/uL — ABNORMAL LOW (ref 3.87–5.11)
RBC: 2.84 MIL/uL — ABNORMAL LOW (ref 3.87–5.11)
RBC: 2.86 MIL/uL — ABNORMAL LOW (ref 3.87–5.11)
RDW: 14.2 % (ref 11.5–15.5)
RDW: 14.5 % (ref 11.5–15.5)
RDW: 14.6 % (ref 11.5–15.5)
WBC: 7.5 10*3/uL (ref 4.0–10.5)

## 2010-12-30 LAB — URINALYSIS, ROUTINE W REFLEX MICROSCOPIC
Bilirubin Urine: NEGATIVE
Glucose, UA: NEGATIVE mg/dL
Ketones, ur: NEGATIVE mg/dL
Protein, ur: NEGATIVE mg/dL

## 2011-01-01 LAB — BASIC METABOLIC PANEL
BUN: 14 mg/dL (ref 6–23)
Chloride: 100 mEq/L (ref 96–112)
Creatinine, Ser: 0.56 mg/dL (ref 0.4–1.2)
GFR calc Af Amer: >60 mL/min (ref >60)
GFR calc non Af Amer: >60 mL/min (ref >60)
Potassium: 4.5 mEq/L (ref 3.5–5.1)

## 2011-01-01 LAB — CBC
MCV: 84.6 fL (ref 78.0–100.0)
Platelets: 332 10*3/uL (ref 150–400)
RBC: 4.64 MIL/uL (ref 3.87–5.11)
RDW: 13.8 % (ref 11.5–15.5)
WBC: 6.7 10*3/uL (ref 4.0–10.5)

## 2011-01-01 LAB — GLUCOSE, CAPILLARY: Glucose-Capillary: 102 mg/dL — ABNORMAL HIGH (ref 70–99)

## 2011-01-02 LAB — CBC
HCT: 25.9 % — ABNORMAL LOW (ref 36.0–46.0)
HCT: 27.1 % — ABNORMAL LOW (ref 36.0–46.0)
HCT: 28.9 % — ABNORMAL LOW (ref 36.0–46.0)
HCT: 31.2 % — ABNORMAL LOW (ref 36.0–46.0)
Hemoglobin: 10.5 g/dL — ABNORMAL LOW (ref 12.0–15.0)
Hemoglobin: 8.7 g/dL — ABNORMAL LOW (ref 12.0–15.0)
Hemoglobin: 9.4 g/dL — ABNORMAL LOW (ref 12.0–15.0)
Hemoglobin: 9.5 g/dL — ABNORMAL LOW (ref 12.0–15.0)
Hemoglobin: 9.6 g/dL — ABNORMAL LOW (ref 12.0–15.0)
MCHC: 33.9 g/dL (ref 30.0–36.0)
MCV: 78.5 fL (ref 78.0–100.0)
MCV: 79 fL (ref 78.0–100.0)
MCV: 79.2 fL (ref 78.0–100.0)
Platelets: 275 10*3/uL (ref 150–400)
Platelets: 282 10*3/uL (ref 150–400)
Platelets: 364 10*3/uL (ref 150–400)
Platelets: 474 10*3/uL — ABNORMAL HIGH (ref 150–400)
Platelets: 480 10*3/uL — ABNORMAL HIGH (ref 150–400)
RBC: 3.5 MIL/uL — ABNORMAL LOW (ref 3.87–5.11)
RBC: 3.64 MIL/uL — ABNORMAL LOW (ref 3.87–5.11)
RBC: 3.93 MIL/uL (ref 3.87–5.11)
RBC: 4.94 MIL/uL (ref 3.87–5.11)
RDW: 15.2 % (ref 11.5–15.5)
RDW: 15.4 % (ref 11.5–15.5)
RDW: 15.7 % — ABNORMAL HIGH (ref 11.5–15.5)
WBC: 10.1 10*3/uL (ref 4.0–10.5)
WBC: 11 10*3/uL — ABNORMAL HIGH (ref 4.0–10.5)
WBC: 14.5 10*3/uL — ABNORMAL HIGH (ref 4.0–10.5)
WBC: 18.1 10*3/uL — ABNORMAL HIGH (ref 4.0–10.5)
WBC: 8.2 10*3/uL (ref 4.0–10.5)
WBC: 8.9 10*3/uL (ref 4.0–10.5)

## 2011-01-02 LAB — BASIC METABOLIC PANEL
BUN: 4 mg/dL — ABNORMAL LOW (ref 6–23)
Calcium: 8 mg/dL — ABNORMAL LOW (ref 8.4–10.5)
Calcium: 8.6 mg/dL (ref 8.4–10.5)
Calcium: 8.7 mg/dL (ref 8.4–10.5)
Chloride: 103 mEq/L (ref 96–112)
Creatinine, Ser: 0.51 mg/dL (ref 0.4–1.2)
Creatinine, Ser: 0.57 mg/dL (ref 0.4–1.2)
Creatinine, Ser: 0.59 mg/dL (ref 0.4–1.2)
GFR calc Af Amer: >60 mL/min (ref >60)
GFR calc non Af Amer: >60 mL/min (ref >60)
GFR calc non Af Amer: >60 mL/min (ref >60)
Glucose, Bld: 139 mg/dL — ABNORMAL HIGH (ref 70–99)
Sodium: 138 mEq/L (ref 135–145)
Sodium: 139 mEq/L (ref 135–145)

## 2011-01-02 LAB — COMPREHENSIVE METABOLIC PANEL
ALT: 23 U/L (ref 0–35)
AST: 24 U/L (ref 0–37)
Albumin: 3.8 g/dL (ref 3.5–5.2)
Alkaline Phosphatase: 64 U/L (ref 39–117)
CO2: 25 mEq/L (ref 19–32)
Chloride: 102 mEq/L (ref 96–112)
Creatinine, Ser: 0.77 mg/dL (ref 0.4–1.2)
GFR calc Af Amer: >60 mL/min (ref >60)
GFR calc non Af Amer: >60 mL/min (ref >60)
Potassium: 3.5 mEq/L (ref 3.5–5.1)
Sodium: 137 mEq/L (ref 135–145)
Total Bilirubin: 0.5 mg/dL (ref 0.3–1.2)

## 2011-01-02 LAB — URINALYSIS, MICROSCOPIC ONLY
Bilirubin Urine: NEGATIVE
Glucose, UA: NEGATIVE mg/dL
Hgb urine dipstick: NEGATIVE
Specific Gravity, Urine: 1.009 (ref 1.005–1.030)
Urobilinogen, UA: 1 mg/dL (ref 0.0–1.0)

## 2011-01-02 LAB — CULTURE, ROUTINE-ABSCESS

## 2011-01-02 LAB — DIFFERENTIAL
Basophils Absolute: 0 10*3/uL (ref 0.0–0.1)
Eosinophils Absolute: 0 10*3/uL (ref 0.0–0.7)
Eosinophils Relative: 0 % (ref 0–5)
Lymphocytes Relative: 4 % — ABNORMAL LOW (ref 12–46)
Monocytes Absolute: 0.3 10*3/uL (ref 0.1–1.0)

## 2011-01-02 LAB — GLUCOSE, CAPILLARY
Glucose-Capillary: 108 mg/dL — ABNORMAL HIGH (ref 70–99)
Glucose-Capillary: 113 mg/dL — ABNORMAL HIGH (ref 70–99)
Glucose-Capillary: 119 mg/dL — ABNORMAL HIGH (ref 70–99)
Glucose-Capillary: 125 mg/dL — ABNORMAL HIGH (ref 70–99)
Glucose-Capillary: 126 mg/dL — ABNORMAL HIGH (ref 70–99)
Glucose-Capillary: 126 mg/dL — ABNORMAL HIGH (ref 70–99)
Glucose-Capillary: 132 mg/dL — ABNORMAL HIGH (ref 70–99)
Glucose-Capillary: 132 mg/dL — ABNORMAL HIGH (ref 70–99)
Glucose-Capillary: 135 mg/dL — ABNORMAL HIGH (ref 70–99)
Glucose-Capillary: 137 mg/dL — ABNORMAL HIGH (ref 70–99)
Glucose-Capillary: 137 mg/dL — ABNORMAL HIGH (ref 70–99)
Glucose-Capillary: 139 mg/dL — ABNORMAL HIGH (ref 70–99)
Glucose-Capillary: 139 mg/dL — ABNORMAL HIGH (ref 70–99)

## 2011-01-02 LAB — PROTIME-INR: Prothrombin Time: 14.2 seconds (ref 11.6–15.2)

## 2011-01-02 LAB — URINE CULTURE: Colony Count: 25000

## 2011-01-02 LAB — CULTURE, BLOOD (ROUTINE X 2): Culture: NO GROWTH

## 2011-01-02 LAB — URINE MICROSCOPIC-ADD ON

## 2011-01-02 LAB — TSH: TSH: 0.291 u[IU]/mL — ABNORMAL LOW (ref 0.350–4.500)

## 2011-01-02 LAB — URINALYSIS, ROUTINE W REFLEX MICROSCOPIC
Bilirubin Urine: NEGATIVE
Ketones, ur: NEGATIVE mg/dL
Specific Gravity, Urine: 1.045 — ABNORMAL HIGH (ref 1.005–1.030)
Urobilinogen, UA: 0.2 mg/dL (ref 0.0–1.0)

## 2011-01-02 LAB — MAGNESIUM: Magnesium: 2 mg/dL (ref 1.5–2.5)

## 2011-01-02 LAB — PHOSPHORUS: Phosphorus: 4.2 mg/dL (ref 2.3–4.6)

## 2011-01-05 LAB — DIFFERENTIAL
Basophils Absolute: 0 10*3/uL (ref 0.0–0.1)
Basophils Relative: 0 % (ref 0–1)
Eosinophils Absolute: 0.1 10*3/uL (ref 0.0–0.7)
Eosinophils Absolute: 0.1 10*3/uL (ref 0.0–0.7)
Eosinophils Relative: 0 % (ref 0–5)
Eosinophils Relative: 1 % (ref 0–5)
Lymphocytes Relative: 8 % — ABNORMAL LOW (ref 12–46)
Lymphs Abs: 0.8 10*3/uL (ref 0.7–4.0)
Lymphs Abs: 1 10*3/uL (ref 0.7–4.0)
Lymphs Abs: 1 10*3/uL (ref 0.7–4.0)
Monocytes Absolute: 0.5 10*3/uL (ref 0.1–1.0)
Monocytes Relative: 4 % (ref 3–12)
Monocytes Relative: 6 % (ref 3–12)
Neutro Abs: 11.4 10*3/uL — ABNORMAL HIGH (ref 1.7–7.7)
Neutrophils Relative %: 85 % — ABNORMAL HIGH (ref 43–77)
Neutrophils Relative %: 87 % — ABNORMAL HIGH (ref 43–77)

## 2011-01-05 LAB — URINALYSIS, ROUTINE W REFLEX MICROSCOPIC
Glucose, UA: NEGATIVE mg/dL
Ketones, ur: NEGATIVE mg/dL
Protein, ur: NEGATIVE mg/dL

## 2011-01-05 LAB — BASIC METABOLIC PANEL
BUN: 11 mg/dL (ref 6–23)
CO2: 24 mEq/L (ref 19–32)
Calcium: 8.2 mg/dL — ABNORMAL LOW (ref 8.4–10.5)
Chloride: 101 mEq/L (ref 96–112)
Chloride: 105 mEq/L (ref 96–112)
GFR calc Af Amer: >60 mL/min (ref >60)
GFR calc Af Amer: >60 mL/min (ref >60)
GFR calc non Af Amer: >60 mL/min (ref >60)
Potassium: 3.4 mEq/L — ABNORMAL LOW (ref 3.5–5.1)
Potassium: 3.5 mEq/L (ref 3.5–5.1)
Potassium: 3.5 mEq/L (ref 3.5–5.1)
Sodium: 136 mEq/L (ref 135–145)

## 2011-01-05 LAB — CBC
HCT: 28 % — ABNORMAL LOW (ref 36.0–46.0)
HCT: 31.9 % — ABNORMAL LOW (ref 36.0–46.0)
HCT: 35.9 % — ABNORMAL LOW (ref 36.0–46.0)
Hemoglobin: 12.4 g/dL (ref 12.0–15.0)
Hemoglobin: 9.7 g/dL — ABNORMAL LOW (ref 12.0–15.0)
MCHC: 34.7 g/dL (ref 30.0–36.0)
MCV: 82.8 fL (ref 78.0–100.0)
MCV: 83 fL (ref 78.0–100.0)
Platelets: 278 10*3/uL (ref 150–400)
RBC: 3.38 MIL/uL — ABNORMAL LOW (ref 3.87–5.11)
RBC: 4.32 MIL/uL (ref 3.87–5.11)
RDW: 19 % — ABNORMAL HIGH (ref 11.5–15.5)
WBC: 13.1 10*3/uL — ABNORMAL HIGH (ref 4.0–10.5)
WBC: 15.9 10*3/uL — ABNORMAL HIGH (ref 4.0–10.5)
WBC: 9.7 10*3/uL (ref 4.0–10.5)

## 2011-01-05 LAB — GLUCOSE, CAPILLARY
Glucose-Capillary: 132 mg/dL — ABNORMAL HIGH (ref 70–99)
Glucose-Capillary: 98 mg/dL (ref 70–99)

## 2011-01-05 LAB — POCT PREGNANCY, URINE: Preg Test, Ur: NEGATIVE

## 2011-01-05 LAB — URINE MICROSCOPIC-ADD ON

## 2011-01-09 LAB — CBC
HCT: 36.6 % (ref 36.0–46.0)
Hemoglobin: 10.1 g/dL — ABNORMAL LOW (ref 12.0–15.0)
Hemoglobin: 10.5 g/dL — ABNORMAL LOW (ref 12.0–15.0)
MCV: 84.5 fL (ref 78.0–100.0)
Platelets: 360 10*3/uL (ref 150–400)
RBC: 3.73 MIL/uL — ABNORMAL LOW (ref 3.87–5.11)
RDW: 17.3 % — ABNORMAL HIGH (ref 11.5–15.5)
RDW: 17.5 % — ABNORMAL HIGH (ref 11.5–15.5)
RDW: 18.3 % — ABNORMAL HIGH (ref 11.5–15.5)
WBC: 10.3 10*3/uL (ref 4.0–10.5)
WBC: 8.5 10*3/uL (ref 4.0–10.5)
WBC: 8.5 10*3/uL (ref 4.0–10.5)

## 2011-01-09 LAB — GLUCOSE, CAPILLARY
Glucose-Capillary: 108 mg/dL — ABNORMAL HIGH (ref 70–99)
Glucose-Capillary: 109 mg/dL — ABNORMAL HIGH (ref 70–99)
Glucose-Capillary: 111 mg/dL — ABNORMAL HIGH (ref 70–99)
Glucose-Capillary: 113 mg/dL — ABNORMAL HIGH (ref 70–99)
Glucose-Capillary: 115 mg/dL — ABNORMAL HIGH (ref 70–99)
Glucose-Capillary: 119 mg/dL — ABNORMAL HIGH (ref 70–99)
Glucose-Capillary: 132 mg/dL — ABNORMAL HIGH (ref 70–99)
Glucose-Capillary: 143 mg/dL — ABNORMAL HIGH (ref 70–99)
Glucose-Capillary: 150 mg/dL — ABNORMAL HIGH (ref 70–99)
Glucose-Capillary: 151 mg/dL — ABNORMAL HIGH (ref 70–99)
Glucose-Capillary: 156 mg/dL — ABNORMAL HIGH (ref 70–99)

## 2011-01-09 LAB — COMPREHENSIVE METABOLIC PANEL
AST: 14 U/L (ref 0–37)
Albumin: 3.5 g/dL (ref 3.5–5.2)
BUN: 11 mg/dL (ref 6–23)
Creatinine, Ser: 0.75 mg/dL (ref 0.4–1.2)
GFR calc Af Amer: >60 mL/min (ref >60)
Total Protein: 7 g/dL (ref 6.0–8.3)

## 2011-01-09 LAB — HEMOGLOBIN A1C: Mean Plasma Glucose: 128 mg/dL

## 2011-01-09 LAB — BASIC METABOLIC PANEL
Calcium: 8 mg/dL — ABNORMAL LOW (ref 8.4–10.5)
GFR calc Af Amer: >60 mL/min (ref >60)
GFR calc non Af Amer: >60 mL/min (ref >60)
Potassium: 3.5 mEq/L (ref 3.5–5.1)
Sodium: 136 mEq/L (ref 135–145)

## 2011-01-19 LAB — VITAMIN B12: Vitamin B-12: 412 pg/mL (ref 211–911)

## 2011-01-20 LAB — CBC
HCT: 39.7 % (ref 36.0–46.0)
Hemoglobin: 13.1 g/dL (ref 12.0–15.0)
MCV: 79.5 fL (ref 78.0–100.0)
RBC: 5 MIL/uL (ref 3.87–5.11)
WBC: 12.7 10*3/uL — ABNORMAL HIGH (ref 4.0–10.5)

## 2011-01-20 LAB — URINE MICROSCOPIC-ADD ON

## 2011-01-20 LAB — DIFFERENTIAL
Eosinophils Absolute: 0.2 10*3/uL (ref 0.0–0.7)
Lymphs Abs: 2.7 10*3/uL (ref 0.7–4.0)
Monocytes Absolute: 0.7 10*3/uL (ref 0.1–1.0)
Monocytes Relative: 6 % (ref 3–12)
Neutrophils Relative %: 71 % (ref 43–77)

## 2011-01-20 LAB — POCT I-STAT, CHEM 8
Calcium, Ion: 1.1 mmol/L — ABNORMAL LOW (ref 1.12–1.32)
Creatinine, Ser: 0.4 mg/dL (ref 0.4–1.2)
Glucose, Bld: 121 mg/dL — ABNORMAL HIGH (ref 70–99)
Hemoglobin: 14.6 g/dL (ref 12.0–15.0)
Potassium: 3.6 mEq/L (ref 3.5–5.1)

## 2011-01-20 LAB — URINALYSIS, ROUTINE W REFLEX MICROSCOPIC
Bilirubin Urine: NEGATIVE
Hgb urine dipstick: NEGATIVE
Specific Gravity, Urine: 1.013 (ref 1.005–1.030)
Urobilinogen, UA: 0.2 mg/dL (ref 0.0–1.0)
pH: 7 (ref 5.0–8.0)

## 2011-01-21 LAB — COMPREHENSIVE METABOLIC PANEL
ALT: 35 U/L (ref 0–35)
AST: 29 U/L (ref 0–37)
CO2: 24 mEq/L (ref 19–32)
Chloride: 104 mEq/L (ref 96–112)
GFR calc Af Amer: >60 mL/min (ref >60)
GFR calc non Af Amer: >60 mL/min (ref >60)
Sodium: 137 mEq/L (ref 135–145)
Total Bilirubin: 0.6 mg/dL (ref 0.3–1.2)

## 2011-01-21 LAB — DIFFERENTIAL
Basophils Absolute: 0 10*3/uL (ref 0.0–0.1)
Eosinophils Absolute: 0.2 10*3/uL (ref 0.0–0.7)
Eosinophils Relative: 2 % (ref 0–5)
Lymphs Abs: 2.2 10*3/uL (ref 0.7–4.0)

## 2011-01-21 LAB — LACTATE DEHYDROGENASE: LDH: 171 U/L (ref 94–250)

## 2011-01-21 LAB — CBC
RBC: 4.46 MIL/uL (ref 3.87–5.11)
WBC: 8.6 10*3/uL (ref 4.0–10.5)

## 2011-03-01 NOTE — Op Note (Signed)
Catherine Castillo, Catherine Castillo             ACCOUNT NO.:  [id number]   MEDICAL RECORD NO.:  [id number]          PATIENT TYPE:  INP   LOCATION:  X005                         FACILITY:  [facility]   PHYSICIAN:  Ollen Gross, M.D.    DATE OF BIRTH:  1961-03-24   DATE OF PROCEDURE:  07/13/2007  DATE OF DISCHARGE:                               OPERATIVE REPORT   PREOPERATIVE DIAGNOSIS:  Osteoarthritis, bilateral knees.   POSTOPERATIVE DIAGNOSIS:  Osteoarthritis, bilateral knees.   PROCEDURE:  Bilateral total knee arthroplasty.   SURGEON:  Ollen Gross, M.D.   ASSISTANT:  Avel Peace, P.A.-C.   ANESTHESIA:  Combined spinal and epidural with epidural for  postoperative pain relief.   ESTIMATED BLOOD LOSS:  Minimal.   DRAIN:  Autovac x1 each side.   TOURNIQUET TIME:  Left 38 minutes at 300 mmHg, right 39 minutes at 300  mmHg.   COMPLICATIONS:  None.   CONDITION:  Stable to recovery.   CLINICAL NOTE:  Catherine Castillo is a 50 year old female with end-stage arthritis  both knees, progressively worsening pain and dysfunction.  She has  failed nonoperative management including multiple injections and  presents for bilateral total knee arthroplasty.   PROCEDURE IN DETAIL:  After successful administration of combined spinal  and epidural anesthetic, a tourniquet is placed on both thighs, and both  lower extremities are prepped and draped in the usual sterile fashion.  We did the left side first.  Left lower extremity is wrapped in Esmarch,  knee flexed, tourniquet inflated to 300 mmHg.  Midline incision made  with a 10 blade through subcutaneous tissue to the level of the extensor  mechanism.  A fresh blade is used make a medial parapatellar arthrotomy.  Soft tissue over the proximal medial tibia is subperiosteally elevated  to the joint line with the knife and into the semimembranosus bursa with  a Cobb elevator.  Soft tissue laterally is elevated with attention being  paid to avoiding the patellar  tendon on the tibial tubercle.  The  patella is subluxed laterally, knee flexed to 90 degrees, ACL and PCL  removed.  Drill is used to create a starting hole in the distal femur,  and the canal is thoroughly irrigated.  The 5 degrees left valgus  alignment guide is placed, and referencing off the posterior condyles,  rotations marked and the block pinned to remove 10 mm of the distal  femur.  Distal femoral resection is made with an oscillating saw.  Sizing blocks placed; size 3 is the most appropriate.  Rotations are  marked off of the epicondylar axis.  Size three cutting block is placed,  and the anterior, posterior and chamfer cuts are made.   The tibia subluxed forward and the menisci are removed.  The  extramedullary tibial alignment guide is placed referencing proximally  at the medial aspect of tibial tubercle and distally along the second  metatarsal axis and tibial crest.  Blocks pinned to remove about 10 mm  of the non-deficient lateral side.  We went 2 more millimeters to get  down to the base of the defect.  Tibial resection is  made an oscillating  saw.  Size 3 is the most appropriate tibial component, and the proximal  tibia is prepared with the modular drill and keel punch for a size 3.  Femoral preparation is completed with the intercondylar cut.   Size 3 mobile bearing tibial trial and size 3 posterior stabilized  femoral trial and a 12.5-mm posterior stabilized rotating platform  insert trial are placed.  With a 12.5, he hyperextended and had a little  laxity in flexion, so went to a 15 which allowed for full extension with  excellent varus valgus and anterior and posterior balance throughout  full range of motion.  The patella is then everted, thickness measured  to 24 mm.  Freehand resection is taken 14 mm, 38 template is placed, lug  holes are drilled, trial patella is placed and it tracks normally.  Osteophytes are removed off the posterior femur with the trial in  place.  All trials removed and the cut bone surfaces are prepared with pulsatile  lavage.  Cements mixed, and once ready for implantation, a size 3 mobile  bearing tibial tray, size 3 posterior stabilized femur and 38 patella  are cemented into  place.  Patella is held with the clamp.  Trial 15  insert is placed.  Knee held in full extension.  All extruded cement  removed.  Once cement is fully hardened and the trials removed, we  copiously irrigate the joint with saline solution.  FloSeal is injected  on the posterior capsule, and the 15-mm posterior stabilized rotating  platform insert is placed in the tibial tray.  Remainder of FloSeal is  injected into the suprapatellar area and medial and lateral gutters.  An  Autovac drain is placed, and then arthrotomy is closed with interrupted  #1 PDS.  Flexion against gravity to 135 degrees.  Tourniquet had already  been released at the time of 38 minutes.  Subcutaneous tissue is then  closed with interrupted 2-0 Vicryl.  Moist sponge is placed on the knee,  and the knee is loosely wrapped in Esmarch.   Right lower extremity is wrapped in Esmarch, knee flexed, tourniquet  inflated to 300.  Same approach is used with the skin incision, the  medial parapatellar arthrotomy.  Soft tissue over the proximal and  medial tibia is elevated to the joint line with the knife and into the  semimembranosus bursa with a Cobb elevator.  Soft tissue laterally is  elevated with attention being paid to avoiding the patellar tendon on  the tibial tubercle.  Patella is subluxed laterally, knee flexed to 90  degrees, ACL and PCL removed.  Drill is used to create a starting hole  in the distal femur, and canal is thoroughly irrigated.  A 5-degree  right valgus alignment guide is placed, and referencing off the  posterior condyles, rotations marked and the block pinned to remove 10  mm of the distal femur.  Distal femoral resection is made with an  oscillating saw.   Size 3 is the most appropriate femoral component.  Size 3 cutting block is placed and rotation marked at the epicondylar  axis.  The anterior, posterior and chamfer cuts are made.   Tibia subluxed forward and menisci are removed.  Extramedullary tibial  alignment guides placed referencing proximally at the medial aspect of  the tibial tubercle and distally along the second metatarsal axis and  tibial crest.  The blocks pinned to remove 10 mm off of the non-  deficient lateral side.  Tibial resection is made with an oscillating  saw.  Size 3 is the most appropriate tibial component, and the proximal  tibia is prepared with the modular drill and keel punch for size 3.  Femoral preparation is completed the intercondylar cut.   Size 3 mobile bearing tibial trial, size 3 posterior stabilized femoral  trial and a 12.5-mm posterior stabilized rotating platform insert trial  are placed.  Full extension is achieved with excellent varus and valgus  balance throughout full range of motion.  Patella is everted, thickness  measured to 24 mm.  Freehand resection is taken to 14 mm, 38 template is  placed, lug holes are drilled, trial patella is placed and it tracks  normally.  Osteophytes are removed off of the posterior femur with the  trial in place.  All trials are removed, and the cut bone surfaces are  prepared with pulsatile lavage.  Cement is mixed, and once ready for  implantation, size 3 mobile bearing tibial tray, size 3 posterior  stabilized femur and 38 patella are cemented into place.  Patella is  held with the clamp.  Trial 12.5 insert is placed, knee held in full  extension and all extruded cement is removed.  Once cement is fully  hardened, then we copiously irrigate with saline solution and place  FloSeal in the posterior capsule.  The permanent 12.5-mm posterior  stabilized rotating platform insert is placed into the tibial tray.  The  remainder of FloSeal is placed in the medial  lateral gutters and  suprapatellar area.  Tourniquet is released at total time of 39 minutes.  Minor bleeding is stopped with cautery.  Wound is again irrigated, and  arthrotomy is closed over an Autovac drain with interrupted #1 PDS.  Subcutaneous tissue is closed with interrupted 2-0 Vicryl and the  subcuticular layers of both knees are closed with running 4-0 Monocryl.  The incisions are cleaned and dried, and Steri-Strips and bulky sterile  dressings are applied.  The drains had already been hooked to suction.  She is placed into knee immobilizers, awakened and transferred to  recovery in stable condition.      Ollen Gross, M.D.  Electronically Signed     FA/MEDQ  D:  07/13/2007  T:  07/14/2007  Job:  [id number]

## 2011-03-01 NOTE — H&P (Signed)
Catherine Castillo, Catherine Castillo             ACCOUNT NO.:  [id number]   MEDICAL RECORD NO.:  [id number]        PATIENT TYPE:  LINP   LOCATION:                               FACILITY:  [facility]   PHYSICIAN:  Ollen Gross, M.D.    DATE OF BIRTH:  11-01-1960   DATE OF ADMISSION:  07/13/2007  DATE OF DISCHARGE:                              HISTORY & PHYSICAL   DATE OF OFFICE VISIT:  July 05, 2007.   CHIEF COMPLAINT:  Bilateral knee pain.   HISTORY OF PRESENT ILLNESS:  The patient 50 year old female who has been  seen by Dr. Lequita Halt for ongoing bilateral knee pain.  She was seen in  the office where she had progressive worsening medial compartment  patellofemoral compartment arthritis.  She is bone-on-bone on the left  which is slightly worse than the right.  She does have end-stage in both  knees and has had progressive pain and dysfunction.  She says she is a  point she would benefit undergoing surgical intervention.  Risks minutes  have been discussed and she would like to proceed with bilateral total  knee replacement arthroplasty.  She has undergone a cardiac stress test  in August 2008 and that has been reviewed.  She has been seen by Dr.  Andi Devon, felt that it was unremarkable, and medically cleared  to undergo surgery.   ALLERGIES:  Amoxicillin causes rash and difficulty breathing.   CURRENT MEDICATIONS:  Triamterene/hydrochlorothiazide, Lipitor,  Synthroid, Lexapro, vitamin D and Lovaza.   PAST MEDICAL HISTORY:  Migraines, anxiety, hypertension, diverticulosis,  hemorrhoids, history of recent urinary tract infection, Graves disease.   PAST SURGICAL HISTORY:  Negative.   SOCIAL HISTORY:  Single, works as a Production designer, theatre/television/film.  Stopped smoking September  2008.  Alcohol - Minimal on social basis.  No children.  She will have  friends assisting with care after surgery.   REVIEW OF SYSTEMS:  GENERAL:  No fevers, chills, night sweats.  NEUROLOGIC:  No seizures, syncope or paralysis.   RESPIRATORY:  No  shortness breath, productive cough or hemoptysis.  CARDIOVASCULAR:  No  chest pain or orthopnea.  GI: Occasional small blood in her stool which  attributes to her hemorrhoids.  No diarrhea or constipation.  No nausea  or vomiting.  GU:  No dysuria, hematuria or discharge.  MUSCULOSKELETAL:  Bilateral knees.   PHYSICAL EXAMINATION:  VITAL SIGNS:  Pulse 68, respirations 12, blood  pressure 114/72.  GENERAL:  A 50 year old female well-nourished, well-developed, in no  acute distress, alert, oriented and cooperative, overweight, excellent  historian.  HEENT: Normocephalic, atraumatic.  Pupils are round and reactive.  Oropharynx clear.  EOMs intact.  Does wear glasses.  NECK:  Supple.  CHEST:  Clear anterior and posterior chest walls.  No rhonchi, rales or  wheezing.  HEART:  Regular rate and rhythm.  No murmur.  S1-S2.  ABDOMEN:  Soft, nontender.  Bowel sounds present.  RECTAL, BREASTS, GENITALIA:  Not done and not pertinent to present  illness.  EXTREMITIES:  Bilateral knees - both knees show of air varus  malalignment deformities, range of motion of 5 to 120 on  the left and  right, no instability.   IMPRESSION:  1. Osteoarthritis, bilateral knees.  2. Migraines.  3. Anxiety.  4. Hypertension.  5. Diverticulosis.  6. Hemorrhoids.  7. History of urinary tract infection.  8. Graves disease.   PLAN:  The patient admitted to [facility] to undergo bilateral  total knee replacement arthroplasties.  Surgery will be performed by Dr.  Ollen Gross.  Dr. Kellie Shropshire is her medical physician, will be  notified of the admission and will be consulted if needed for any  medical assistance with this patient throughout the hospital course.      Alexzandrew L. Perkins, P.A.C.      Ollen Gross, M.D.  Electronically Signed    ALP/MEDQ  D:  07/12/2007  T:  07/13/2007  Job:  [id number]   cc:   Merlene Laughter. Renae Gloss, M.D.  Fax: [phone]

## 2011-03-04 NOTE — Discharge Summary (Signed)
NAMEJAZLINE, CUMBEE             ACCOUNT NO.:  [id number]   MEDICAL RECORD NO.:  [id number]          PATIENT TYPE:  INP   LOCATION:  1608                         FACILITY:  [facility]   PHYSICIAN:  Ollen Gross, M.D.    DATE OF BIRTH:  1961/05/31   DATE OF ADMISSION:  07/13/2007  DATE OF DISCHARGE:  07/18/2007                               DISCHARGE SUMMARY   ADMISSION DIAGNOSES:  1. Osteoarthritis, bilateral knees.  2. Migraines.  3. Anxiety.  4. Hypertension.  5. Diverticulosis.  6. Hemorrhoids.  7. History of urinary tract infection.  8. Grave's disease.   DISCHARGE DIAGNOSES:  1. Bilateral knee osteoarthritis, status post bilateral total knee      replacement arthroplasty.  2. Postoperative blood loss anemia, did not require transfusion.  3. Migraines.  4. Anxiety.  5. Hypertension.  6. Diverticulosis.  7. Hemorrhoids.  8. History of urinary tract infection.  9. Grave's disease.   PROCEDURE:  July 13, 2007 bilateral total knee arthroplasty.  Surgeon:  Dr. Lequita Halt.  Assistant:  Avel Peace, PA-C.  Anesthesia:  Combined spinal with epidural.  Tourniquet time on the left 38,  tourniquet time on the right 39 minutes.   CONSULTATIONS:  Rehab services.   BRIEF HISTORY:  Catherine Castillo is a 50 year old female with end-stage arthritis  of both knees, progressive worsening pain and dysfunction.  Failed  nonoperative management including multiple injections.  Now presents for  total knee arthroplasties.   LABORATORY DATA:  Preop CBC:  Hemoglobin 13.1, hematocrit 38.3, white  cell count 11.2.  Postop hemoglobin 10.2, drifted down to 9.8.  Last  noted H&H 9.2 and 26.2.  PT/PTT preop 13.2 and 33, respectively.  INR  1.0.  Serial pro times followed.  Last noted PT/INR 20.9 and 1.7.  Chem  panel on admission:  Elevated glucose 151, remaining Chem panel within  normal limits.  Serial BMETs were followed.  Electrolytes remained  within normal limits.  Preop urine pregnancy negative.   Preop UA:  Large  hemoglobin, many epithelials, only 3-6 white cells.  Blood group type  A+.  Two-view chest July 06, 2007:  Chronic-appearing bronchitic  changes, likely related to smoking.  Heart size upper limits of normal.  No acute pulmonary findings.   HOSPITAL COURSE:  The patient was admitted to [facility],  tolerated her procedure well, later transferred to the recovery room and  the orthopedic floor.  Started on epidural per anesthesia for initial  pain control.  Was doing pretty well on the morning of day #1.  Pain was  under fairly good control.  Hemovacs were discontinued.  Anesthesia  followed her for regulation of the epidural control and had decent  anesthesia relief after surgery and also on postop day #1 and through  the night until postop day #2, had a little bit of O2 saturation drop  when she was sleeping but was being otherwise maintained on nasal  cannula.  Placed on PCA and the epidural was removed, and also placed on  p.o. medications.  Once the epidural was out, she was able to get up a  little bit  more.  She was started on physical therapy after surgery.  By  day #2 she was already up ambulating 40 feet.  She did very well and  increased her therapy up to 93 on day #3 and dressings changed on day  #2, both incisions healing well, no signs of infection.  Due to being  bilateral knees, a rehab consult was called in.  The patient was seen by  Dr. Ellwood Dense from a rehab standpoint and felt that she could be  appropriate for short stay, but we checked the insurance on day #3.  She  started getting up doing a little bit more with therapy.  She continued  to progress well and the patient actually wanted to go home.  By day #4  she was progressing well.  We decided to switch gears and make  arrangements for home.  Discharge planning got involved to arrange home  health, continued to receive another day of her therapy, and by postop  day #5 she was  doing well and was ready to go home.   DISCHARGE PLAN:  1. Patient discharged home on July 18, 2007.  2. Discharge diagnoses:  Please see above.  3. Discharge medications:  Percocet, Robaxin, Coumadin.   ACTIVITY:  Weightbearing as tolerated to both legs.  May start  showering.  Do not submerge the incisions under water.  Home health PT,  home health nursing, total knee protocol to both legs.   FOLLOW-UP:  Tuesday or Thursday following discharge.  Call for an  appointment time.   DISPOSITION:  Home.   CONDITION UPON DISCHARGE:  Improving.      Alexzandrew L. Perkins, P.A.C.      Ollen Gross, M.D.  Electronically Signed    ALP/MEDQ  D:  08/07/2007  T:  08/08/2007  Job:  [id number]   cc:   Merlene Laughter. Renae Gloss, M.D.  Fax: [phone]

## 2011-03-29 ENCOUNTER — Encounter: Payer: Self-pay | Admitting: Family Medicine

## 2011-03-30 ENCOUNTER — Ambulatory Visit (INDEPENDENT_AMBULATORY_CARE_PROVIDER_SITE_OTHER): Payer: BC Managed Care – PPO | Admitting: Family Medicine

## 2011-03-30 ENCOUNTER — Other Ambulatory Visit: Payer: Self-pay | Admitting: Oncology

## 2011-03-30 ENCOUNTER — Encounter (HOSPITAL_BASED_OUTPATIENT_CLINIC_OR_DEPARTMENT_OTHER): Payer: BC Managed Care – PPO | Admitting: Oncology

## 2011-03-30 ENCOUNTER — Encounter: Payer: Self-pay | Admitting: Family Medicine

## 2011-03-30 DIAGNOSIS — Z170 Estrogen receptor positive status [ER+]: Secondary | ICD-10-CM

## 2011-03-30 DIAGNOSIS — E119 Type 2 diabetes mellitus without complications: Secondary | ICD-10-CM

## 2011-03-30 DIAGNOSIS — E785 Hyperlipidemia, unspecified: Secondary | ICD-10-CM

## 2011-03-30 DIAGNOSIS — I10 Essential (primary) hypertension: Secondary | ICD-10-CM

## 2011-03-30 DIAGNOSIS — C50519 Malignant neoplasm of lower-outer quadrant of unspecified female breast: Secondary | ICD-10-CM

## 2011-03-30 DIAGNOSIS — C50919 Malignant neoplasm of unspecified site of unspecified female breast: Secondary | ICD-10-CM

## 2011-03-30 DIAGNOSIS — E039 Hypothyroidism, unspecified: Secondary | ICD-10-CM

## 2011-03-30 DIAGNOSIS — E559 Vitamin D deficiency, unspecified: Secondary | ICD-10-CM

## 2011-03-30 LAB — BASIC METABOLIC PANEL
CO2: 27 mEq/L (ref 19–32)
Calcium: 9.2 mg/dL (ref 8.4–10.5)
GFR: 96.76 mL/min (ref >60.00)
Potassium: 4.6 mEq/L (ref 3.5–5.1)
Sodium: 140 mEq/L (ref 135–145)

## 2011-03-30 LAB — COMPREHENSIVE METABOLIC PANEL
Albumin: 4.4 g/dL (ref 3.5–5.2)
Alkaline Phosphatase: 67 U/L (ref 39–117)
BUN: 14 mg/dL (ref 6–23)
CO2: 25 mEq/L (ref 19–32)
Calcium: 9.7 mg/dL (ref 8.4–10.5)
Chloride: 102 mEq/L (ref 96–112)
Glucose, Bld: 110 mg/dL — ABNORMAL HIGH (ref 70–99)
Potassium: 4.4 mEq/L (ref 3.5–5.3)
Sodium: 138 mEq/L (ref 135–145)
Total Protein: 7.2 g/dL (ref 6.0–8.3)

## 2011-03-30 LAB — CBC WITH DIFFERENTIAL/PLATELET
BASO%: 0.4 % (ref 0.0–2.0)
Basophils Absolute: 0 10*3/uL (ref 0.0–0.1)
EOS%: 1.6 % (ref 0.0–7.0)
HCT: 38.2 % (ref 36.0–46.0)
HCT: 38.9 % (ref 34.8–46.6)
LYMPH%: 24.7 % (ref 14.0–49.7)
Lymphs Abs: 1.8 10*3/uL (ref 0.7–4.0)
MCH: 29.4 pg (ref 25.1–34.0)
MCHC: 34.3 g/dL (ref 31.5–36.0)
MONO%: 5.5 % (ref 0.0–14.0)
Monocytes Relative: 6.1 % (ref 3.0–12.0)
NEUT%: 67.8 % (ref 38.4–76.8)
Neutrophils Relative %: 66.5 % (ref 43.0–77.0)
Platelets: 319 10*3/uL (ref 150.0–400.0)
Platelets: 318 10*3/uL (ref 145–400)
RDW: 14.7 % — ABNORMAL HIGH (ref 11.5–14.6)

## 2011-03-30 LAB — HEPATIC FUNCTION PANEL
AST: 35 U/L (ref 0–37)
Alkaline Phosphatase: 64 U/L (ref 39–117)
Bilirubin, Direct: 0.1 mg/dL (ref 0.0–0.3)
Total Protein: 7.4 g/dL (ref 6.0–8.3)

## 2011-03-30 LAB — FERRITIN: Ferritin: 30 ng/mL (ref 10–291)

## 2011-03-30 LAB — HEMOGLOBIN A1C: Hgb A1c MFr Bld: 6.9 % — ABNORMAL HIGH (ref 4.6–6.5)

## 2011-03-30 LAB — LIPID PANEL
HDL: 42.8 mg/dL (ref >39.00)
Total CHOL/HDL Ratio: 6
Triglycerides: 479 mg/dL — ABNORMAL HIGH (ref 0.0–149.0)

## 2011-03-30 NOTE — Patient Instructions (Signed)
Schedule your complete physical for late August/early Sept We'll notify you or your lab results and make any med changes as needed Call with any questions or concerns Sherri Rad in there!  You look great!

## 2011-03-30 NOTE — Assessment & Plan Note (Signed)
Pt has been following w/ endo for this.  Not currently on meds- was previously controlling w/ diet and exercise.  Pt admittedly 'slacked' on this after surgery but is trying to 'do better'.  Will check labs and start meds prn.  Pt expressed understanding and is in agreement w/ plan.

## 2011-03-30 NOTE — Assessment & Plan Note (Signed)
Check labs to assess if med regimen is appropriate.  Adjust meds prn.

## 2011-03-30 NOTE — Assessment & Plan Note (Signed)
BP is excellently controlled.  Asymptomatic.  No changes.

## 2011-03-30 NOTE — Assessment & Plan Note (Signed)
Tolerating statin w/out difficulty.  Check labs and adjust meds prn.

## 2011-03-30 NOTE — Progress Notes (Signed)
  Subjective:    Patient ID: Catherine Castillo, female    DOB: 02-10-1961, 50 y.o.   MRN: [id number]  HPI HTN- chronic problem for pt, BP is excellently controlled.  No CP, SOB, HAs, visual changes, edema.  Hypothyroid- chronic problem for pt, has been seeing Dr Lucianne Muss.  Prefers to stop seeing Endo if possible.  Labs last checked in November.  Complains of some fatigue and difficulty getting endurance back after extensive surgical interventions last fall.  Elevated CBGs- was noted by Endo that sugars were mildly elevated.  Was controlled w/ diet and exercise prior to surgery.  Pt now concerned that this is even higher than before.  Will sporadically check CBGs and AM sugars are ~120.  Has previously taken Metformin and was 'sick as a dog'.  Hyperlipidemia- chronic problem for pt, due for labs.  Denies abd pain, N/V, myalgias.   Review of Systems For ROS see HPI     Objective:   Physical Exam  Constitutional: She is oriented to person, place, and time. She appears well-developed and well-nourished. No distress.  HENT:  Head: Normocephalic and atraumatic.  Eyes: Conjunctivae and EOM are normal. Pupils are equal, round, and reactive to light.  Neck: Normal range of motion. Neck supple. No thyromegaly present.  Cardiovascular: Normal rate, regular rhythm, normal heart sounds and intact distal pulses.   No murmur heard. Pulmonary/Chest: Effort normal and breath sounds normal. No respiratory distress.  Abdominal: Soft. She exhibits no distension. There is no tenderness.  Musculoskeletal: She exhibits no edema.  Lymphadenopathy:    She has no cervical adenopathy.  Neurological: She is alert and oriented to person, place, and time.  Skin: Skin is warm and dry.  Psychiatric: She has a normal mood and affect. Her behavior is normal.          Assessment & Plan:

## 2011-04-05 ENCOUNTER — Encounter: Payer: Self-pay | Admitting: *Deleted

## 2011-04-05 MED ORDER — LEVOTHYROXINE SODIUM 200 MCG PO TABS: 200.0000 ug | ORAL_TABLET | Freq: Every day | ORAL | Status: DC

## 2011-04-05 MED ORDER — FENOFIBRATE 160 MG PO TABS: 160.0000 mg | ORAL_TABLET | Freq: Every day | ORAL | Status: DC

## 2011-04-05 MED ORDER — LISINOPRIL 10 MG PO TABS: 10.0000 mg | ORAL_TABLET | Freq: Every day | ORAL | Status: DC

## 2011-04-05 NOTE — Progress Notes (Signed)
Addended by: Lucious Groves I on: 04/05/2011 11:33 AM   Modules accepted: Orders

## 2011-05-28 ENCOUNTER — Other Ambulatory Visit: Payer: Self-pay | Admitting: Family Medicine

## 2011-06-21 ENCOUNTER — Other Ambulatory Visit: Payer: Self-pay | Admitting: Family Medicine

## 2011-06-21 MED ORDER — ATORVASTATIN CALCIUM 40 MG PO TABS: 40.0000 mg | ORAL_TABLET | Freq: Every day | ORAL | Status: DC

## 2011-06-21 NOTE — Telephone Encounter (Signed)
Done

## 2011-06-22 ENCOUNTER — Encounter: Payer: BC Managed Care – PPO | Admitting: Family Medicine

## 2011-07-14 ENCOUNTER — Encounter: Payer: Self-pay | Admitting: Family Medicine

## 2011-07-28 LAB — BASIC METABOLIC PANEL
BUN: 7
CO2: 32
Calcium: 7.7 — ABNORMAL LOW
Calcium: 8 — ABNORMAL LOW
Creatinine, Ser: 0.66
Creatinine, Ser: 0.68
GFR calc Af Amer: >60
GFR calc non Af Amer: >60
Glucose, Bld: 157 — ABNORMAL HIGH

## 2011-07-28 LAB — COMPREHENSIVE METABOLIC PANEL
AST: 25
CO2: 30
Calcium: 9.6
Creatinine, Ser: 0.68
GFR calc Af Amer: >60
GFR calc non Af Amer: >60
Total Protein: 7.4

## 2011-07-28 LAB — PROTIME-INR
INR: 1
INR: 1.2
INR: 1.2
INR: 1.7 — ABNORMAL HIGH
Prothrombin Time: 15
Prothrombin Time: 15.5 — ABNORMAL HIGH
Prothrombin Time: 23.3 — ABNORMAL HIGH

## 2011-07-28 LAB — URINE MICROSCOPIC-ADD ON

## 2011-07-28 LAB — CBC
Hemoglobin: 9.2 — ABNORMAL LOW
MCHC: 34.1
MCHC: 34.6
MCV: 85.4
Platelets: 292
Platelets: 347
RBC: 3.32 — ABNORMAL LOW
RBC: 4.49
RDW: 14.2 — ABNORMAL HIGH
RDW: 14.3 — ABNORMAL HIGH
RDW: 14.4 — ABNORMAL HIGH

## 2011-07-28 LAB — URINALYSIS, ROUTINE W REFLEX MICROSCOPIC
Bilirubin Urine: NEGATIVE
Ketones, ur: NEGATIVE
Specific Gravity, Urine: 1.02
pH: 7

## 2011-07-28 LAB — TYPE AND SCREEN: Antibody Screen: NEGATIVE

## 2011-07-28 LAB — PREGNANCY, URINE: Preg Test, Ur: NEGATIVE

## 2011-08-04 ENCOUNTER — Other Ambulatory Visit: Payer: Self-pay | Admitting: Oncology

## 2011-08-04 ENCOUNTER — Encounter (HOSPITAL_BASED_OUTPATIENT_CLINIC_OR_DEPARTMENT_OTHER): Payer: BC Managed Care – PPO | Admitting: Oncology

## 2011-08-04 DIAGNOSIS — C50519 Malignant neoplasm of lower-outer quadrant of unspecified female breast: Secondary | ICD-10-CM

## 2011-08-04 DIAGNOSIS — C50919 Malignant neoplasm of unspecified site of unspecified female breast: Secondary | ICD-10-CM

## 2011-08-04 LAB — COMPREHENSIVE METABOLIC PANEL WITH GFR
ALT: 25 U/L (ref 0–35)
AST: 18 U/L (ref 0–37)
Albumin: 3.6 g/dL (ref 3.5–5.2)
Alkaline Phosphatase: 77 U/L (ref 39–117)
BUN: 18 mg/dL (ref 6–23)
CO2: 28 meq/L (ref 19–32)
Calcium: 9.5 mg/dL (ref 8.4–10.5)
Chloride: 102 meq/L (ref 96–112)
Creatinine, Ser: 0.57 mg/dL (ref 0.50–1.10)
Glucose, Bld: 213 mg/dL — ABNORMAL HIGH (ref 70–99)
Potassium: 3.7 meq/L (ref 3.5–5.3)
Sodium: 139 meq/L (ref 135–145)
Total Bilirubin: 0.2 mg/dL — ABNORMAL LOW (ref 0.3–1.2)
Total Protein: 7 g/dL (ref 6.0–8.3)

## 2011-08-04 LAB — CBC WITH DIFFERENTIAL/PLATELET
BASO%: 0.3 % (ref 0.0–2.0)
Basophils Absolute: 0 10e3/uL (ref 0.0–0.1)
EOS%: 1.7 % (ref 0.0–7.0)
Eosinophils Absolute: 0.1 10e3/uL (ref 0.0–0.5)
HCT: 37.7 % (ref 34.8–46.6)
HGB: 13 g/dL (ref 11.6–15.9)
LYMPH%: 26.4 % (ref 14.0–49.7)
MCH: 29.8 pg (ref 25.1–34.0)
MCHC: 34.5 g/dL (ref 31.5–36.0)
MCV: 86.5 fL (ref 79.5–101.0)
MONO#: 0.4 10e3/uL (ref 0.1–0.9)
MONO%: 5.2 % (ref 0.0–14.0)
NEUT#: 4.7 10e3/uL (ref 1.5–6.5)
NEUT%: 66.4 % (ref 38.4–76.8)
Platelets: 304 10e3/uL (ref 145–400)
RBC: 4.35 10e6/uL (ref 3.70–5.45)
RDW: 13.6 % (ref 11.2–14.5)
WBC: 7.1 10e3/uL (ref 3.9–10.3)
lymph#: 1.9 10e3/uL (ref 0.9–3.3)

## 2011-08-15 ENCOUNTER — Other Ambulatory Visit: Payer: Self-pay | Admitting: Family Medicine

## 2011-08-16 ENCOUNTER — Other Ambulatory Visit: Payer: Self-pay | Admitting: *Deleted

## 2011-08-16 ENCOUNTER — Encounter: Payer: Self-pay | Admitting: *Deleted

## 2011-08-16 MED ORDER — ATORVASTATIN CALCIUM 40 MG PO TABS: 40.0000 mg | ORAL_TABLET | Freq: Every day | ORAL | Status: DC

## 2011-08-16 NOTE — Telephone Encounter (Signed)
Noted the pt has not had an office visit since 03-30-11 noted on last visit MD noted to have CPE in August/Sept, however pt noted cancellation for that appt. Refill the atrovastin via e script for 30 tablets no refills and sent letter to pt address to advise labs and cpe due.

## 2011-08-29 ENCOUNTER — Telehealth: Payer: Self-pay | Admitting: Family Medicine

## 2011-08-29 DIAGNOSIS — E039 Hypothyroidism, unspecified: Secondary | ICD-10-CM

## 2011-08-29 DIAGNOSIS — E119 Type 2 diabetes mellitus without complications: Secondary | ICD-10-CM

## 2011-08-29 DIAGNOSIS — E785 Hyperlipidemia, unspecified: Secondary | ICD-10-CM

## 2011-08-29 DIAGNOSIS — Z0000 Encounter for general adult medical examination without abnormal findings: Secondary | ICD-10-CM

## 2011-08-29 NOTE — Telephone Encounter (Signed)
Please advise 

## 2011-08-29 NOTE — Telephone Encounter (Signed)
CBC w/ diff BMP LFTs/hepatic panel TSH Lipid panel Vit D

## 2011-08-30 NOTE — Telephone Encounter (Signed)
Left message to call office

## 2011-09-02 NOTE — Telephone Encounter (Signed)
Left message to call office

## 2011-09-05 NOTE — Telephone Encounter (Signed)
Spoke with Pt who states that she is currently out of town and will call when she is back to give fax number to send lab orders to.

## 2011-09-29 ENCOUNTER — Ambulatory Visit (HOSPITAL_BASED_OUTPATIENT_CLINIC_OR_DEPARTMENT_OTHER): Payer: BC Managed Care – PPO | Admitting: Oncology

## 2011-09-29 ENCOUNTER — Telehealth: Payer: Self-pay | Admitting: *Deleted

## 2011-09-29 VITALS — BP 137/88 | HR 105 | Temp 98.3°F | Ht 68.0 in | Wt 249.0 lb

## 2011-09-29 DIAGNOSIS — Z170 Estrogen receptor positive status [ER+]: Secondary | ICD-10-CM

## 2011-09-29 DIAGNOSIS — C50919 Malignant neoplasm of unspecified site of unspecified female breast: Secondary | ICD-10-CM

## 2011-09-29 NOTE — Telephone Encounter (Signed)
gave patient appointment for 07-2012 printed out calendar and gave to the patient

## 2011-09-29 NOTE — Progress Notes (Signed)
ID: Merly A Bulls   Interval History: The patient returns today for followup of her breast cancer. Interval history significant for her planning to retire a week from now. She is not quite sure what she is going to do with the extra time she is going to get. Perhaps she will consult. 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  ROS: She is tolerating the tamoxifen with only hot flashes as a side effect. These do wake her up at night. However she is not interested in taking gabapentin or any other medication for this. Otherwise a detailed review of systems today was entirely unremarkable. The  Medications: I have reviewed the patient's current medications.  Current Outpatient Prescriptions  Medication Sig Dispense Refill  . atorvastatin (LIPITOR) 40 MG tablet Take 1 tablet (40 mg total) by mouth at bedtime.  30 tablet  0  . levothyroxine (SYNTHROID, LEVOTHROID) 200 MCG tablet TAKE 1 TABLET BY MOUTH EVERY DAY  30 tablet  5  . lisinopril (PRINIVIL,ZESTRIL) 10 MG tablet TAKE 1 TABLET BY MOUTH EVERY DAY  30 tablet  5  . Probiotic Product (ALIGN) 4 MG CAPS Take by mouth daily.        . tamoxifen (NOLVADEX) 20 MG tablet Take 20 mg by mouth daily.        . fenofibrate 160 MG tablet TAKE 1 TABLET BY MOUTH EVERY DAY  30 tablet  5     Objective:  Filed Vitals:   09/29/11 1150  BP: 137/88  Pulse: 105  Temp: 98.3 F (36.8 C)    Physical Exam:    Sclerae unicteric  Oropharynx clear  No peripheral adenopathy  Lungs clear -- no rales or rhonchi  Heart regular rate and rhythm  Abdomen benign  MSK no focal spinal tenderness, no peripheral edema  Neuro nonfocal  Breast exam: Right breast no suspicious findings; left breast status post lumpectomy no evidence of local recurrence  Lab Results:   CMP    Chemistry      Component Value Date/Time   NA 139 08/04/2011 1507   K 3.7  08/04/2011 1507   CL 102 08/04/2011 1507   CO2 28 08/04/2011 1507   BUN 18 08/04/2011 1507   CREATININE 0.57 08/04/2011 1507      Component Value Date/Time   CALCIUM 9.5 08/04/2011 1507   ALKPHOS 77 08/04/2011 1507   AST 18 08/04/2011 1507   ALT 25 08/04/2011 1507   BILITOT 0.2* 08/04/2011 1507       CBC Lab Results  Component Value Date   WBC 7.1 08/04/2011   HGB 13.0 08/04/2011   HCT 37.7 08/04/2011   MCV 86.5 08/04/2011   PLT 304 08/04/2011   NEUTROABS 4.7 08/04/2011    Studies/Results:  She had mammography at [facility] September 2012, and it was unremarkable.  Assessment: 50 year old [place] woman status post left lumpectomy and sentinel lymph node sampling October of 2010 for a T1b N0 (Stage I) invasive ductal carcinoma, grade 1, estrogen and progesterone receptor positive, HER-2 not amplified, with an MIB-1-1 of 14%; status post radiation completed December of 2010 at which time she started tamoxifen.   Plan: We discussed the new data I can continue the tamoxifen for 10 years and also the old data switching from tamoxifen to an aromatase inhibitor after 2-3 years. She understands in either case she could obtain a few extra percentage  points reduction in risk of recurrence. Her overall prognosis is so good however I think in her case the benefit of changing would be minimal. The plan we decided on today is simply to continue tamoxifen for total of 5 years and then discontinue followup.  I'm going to start seeing her on a once a year basis. Her next visit he will be October of 2013.  MAGRINAT,GUSTAV C 09/29/2011

## 2011-10-06 ENCOUNTER — Encounter: Payer: Self-pay | Admitting: Family Medicine

## 2011-10-07 ENCOUNTER — Ambulatory Visit (INDEPENDENT_AMBULATORY_CARE_PROVIDER_SITE_OTHER): Payer: BC Managed Care – PPO | Admitting: Family Medicine

## 2011-10-07 ENCOUNTER — Encounter: Payer: Self-pay | Admitting: Family Medicine

## 2011-10-07 DIAGNOSIS — Z0000 Encounter for general adult medical examination without abnormal findings: Secondary | ICD-10-CM | POA: Insufficient documentation

## 2011-10-07 DIAGNOSIS — E785 Hyperlipidemia, unspecified: Secondary | ICD-10-CM

## 2011-10-07 DIAGNOSIS — I10 Essential (primary) hypertension: Secondary | ICD-10-CM

## 2011-10-07 DIAGNOSIS — E119 Type 2 diabetes mellitus without complications: Secondary | ICD-10-CM

## 2011-10-07 LAB — HEPATIC FUNCTION PANEL
Bilirubin, Direct: 0 mg/dL (ref 0.0–0.3)
Total Protein: 7 g/dL (ref 6.0–8.3)

## 2011-10-07 LAB — CBC WITH DIFFERENTIAL/PLATELET
Basophils Absolute: 0 10*3/uL (ref 0.0–0.1)
Eosinophils Absolute: 0.2 10*3/uL (ref 0.0–0.7)
HCT: 38 % (ref 36.0–46.0)
Hemoglobin: 13 g/dL (ref 12.0–15.0)
Lymphs Abs: 1.7 10*3/uL (ref 0.7–4.0)
MCHC: 34.1 g/dL (ref 30.0–36.0)
MCV: 88.9 fl (ref 78.0–100.0)
Monocytes Absolute: 0.6 10*3/uL (ref 0.1–1.0)
Monocytes Relative: 8.1 % (ref 3.0–12.0)
Neutro Abs: 4.4 10*3/uL (ref 1.4–7.7)
Platelets: 280 10*3/uL (ref 150.0–400.0)
RDW: 13.9 % (ref 11.5–14.6)

## 2011-10-07 LAB — BASIC METABOLIC PANEL
CO2: 28 mEq/L (ref 19–32)
Calcium: 9 mg/dL (ref 8.4–10.5)
Creatinine, Ser: 0.6 mg/dL (ref 0.4–1.2)
GFR: 111.56 mL/min (ref >60.00)
Sodium: 140 mEq/L (ref 135–145)

## 2011-10-07 LAB — LIPID PANEL
Cholesterol: 203 mg/dL — ABNORMAL HIGH (ref 0–200)
HDL: 45.4 mg/dL (ref >39.00)
Total CHOL/HDL Ratio: 4
Triglycerides: 226 mg/dL — ABNORMAL HIGH (ref 0.0–149.0)

## 2011-10-07 LAB — LDL CHOLESTEROL, DIRECT: Direct LDL: 116.9 mg/dL

## 2011-10-07 LAB — HEMOGLOBIN A1C: Hgb A1c MFr Bld: 7.6 % — ABNORMAL HIGH (ref 4.6–6.5)

## 2011-10-07 MED ORDER — LISINOPRIL 10 MG PO TABS: 10.0000 mg | ORAL_TABLET | Freq: Every day | ORAL | Status: DC

## 2011-10-07 MED ORDER — ATORVASTATIN CALCIUM 40 MG PO TABS: 40.0000 mg | ORAL_TABLET | Freq: Every day | ORAL | Status: DC

## 2011-10-07 MED ORDER — LEVOTHYROXINE SODIUM 200 MCG PO TABS: 200.0000 ug | ORAL_TABLET | Freq: Every day | ORAL | Status: DC

## 2011-10-07 NOTE — Progress Notes (Signed)
  Subjective:    Patient ID: Catherine Castillo, female    DOB: 1960/11/10, 50 y.o.   MRN: [id number]  HPI CPE- UTD on GYN, colonoscopy.  DM- chronic problem.  Currently attempting to control w/ diet and exercise.  Not on meds.  Not checking sugars regularly.  Fasting CBGs 110s.  Admits that this year she did not follow healthy diet or exercise regularly.  Has gained weight.  UTD on eye exam.  HTN- chronic problem.  Adequate control on Lisinopril.  No CP, SOB, HAs, visual changes, edema.  Hyperlipidemia- chronic problem.  On Lipitor and fenofibrate.     Review of Systems Patient reports no vision/ hearing changes, adenopathy,fever, weight change,  persistant/recurrent hoarseness , swallowing issues, chest pain, palpitations, edema, persistant/recurrent cough, hemoptysis, dyspnea (rest/exertional/paroxysmal nocturnal), gastrointestinal bleeding (melena, rectal bleeding), significant heartburn, bowel changes, GU symptoms (dysuria, hematuria, incontinence), Gyn symptoms (abnormal  bleeding, pain),  syncope, focal weakness, memory loss, numbness & tingling, skin/hair/nail changes, abnormal bruising or bleeding, anxiety, or depression.  + gas pain     Objective:   Physical Exam General Appearance:    Alert, cooperative, no distress, appears stated age  Head:    Normocephalic, without obvious abnormality, atraumatic  Eyes:    PERRL, conjunctiva/corneas clear, EOM's intact, fundi    benign, both eyes  Ears:    Normal TM's and external ear canals, both ears  Nose:   Nares normal, septum midline, mucosa normal, no drainage    or sinus tenderness  Throat:   Lips, mucosa, and tongue normal; teeth and gums normal  Neck:   Supple, symmetrical, trachea midline, no adenopathy;    Thyroid: no enlargement/tenderness/nodules  Back:     Symmetric, no curvature, ROM normal, no CVA tenderness  Lungs:     Clear to auscultation bilaterally, respirations unlabored  Chest Wall:    No tenderness or deformity   Heart:    Regular rate and rhythm, S1 and S2 normal, no murmur, rub   or gallop  Breast Exam:    Deferred to GYN  Abdomen:     Soft, non-tender, bowel sounds active all four quadrants,    no masses, no organomegaly  Genitalia:    Deferred to GYN  Rectal:    Extremities:   Extremities normal, atraumatic, no cyanosis or edema  Pulses:   2+ and symmetric all extremities  Skin:   Skin color, texture, turgor normal, no rashes or lesions  Lymph nodes:   Cervical, supraclavicular, and axillary nodes normal  Neurologic:   CNII-XII intact, normal strength, sensation and reflexes    throughout          Assessment & Plan:

## 2011-10-07 NOTE — Patient Instructions (Signed)
Follow up in 3 months to recheck diabetes We'll notify you of your lab results and make any changes if needed Try and make healthy food choices and get regular exercise Call with any questions or concerns Happy Holidays!!!

## 2011-10-19 ENCOUNTER — Telehealth: Payer: Self-pay

## 2011-10-19 NOTE — Telephone Encounter (Signed)
Call ro patient and she stated she was returning your call.    KP

## 2011-10-19 NOTE — Telephone Encounter (Signed)
Spoke to pt to advise results/instructions. Pt understood.  

## 2011-10-21 ENCOUNTER — Other Ambulatory Visit: Payer: Self-pay | Admitting: Family Medicine

## 2011-10-21 MED ORDER — LISINOPRIL 10 MG PO TABS: 10.0000 mg | ORAL_TABLET | Freq: Every day | ORAL | Status: DC

## 2011-10-21 NOTE — Telephone Encounter (Signed)
rx sent to pharmacy by e-script  

## 2011-10-23 NOTE — Assessment & Plan Note (Signed)
Pt's PE WNL.  UTD on GYN, health maintenance.  Check labs.  Anticipatory guidance provided.

## 2011-10-23 NOTE — Assessment & Plan Note (Signed)
Chronic problem.  Tolerating statin w/out difficulty.  Due for labs.  Adjust meds prn.

## 2011-10-23 NOTE — Assessment & Plan Note (Signed)
Chronic problem.  Adequate control.  Asymptomatic.  No changes. 

## 2011-10-23 NOTE — Assessment & Plan Note (Signed)
Chronic problem.  Has been attempting to control w/ diet and exercise but admits to poor compliance recently.  Due for labs.  UTD on eye exam.  Will follow closely.

## 2011-10-31 ENCOUNTER — Other Ambulatory Visit: Payer: Self-pay | Admitting: Family Medicine

## 2011-10-31 MED ORDER — LISINOPRIL 10 MG PO TABS: 10.0000 mg | ORAL_TABLET | Freq: Every day | ORAL | Status: DC

## 2011-10-31 NOTE — Telephone Encounter (Signed)
rx sent to pharmacy by e-script  

## 2011-11-22 IMAGING — CT CT ABD-PELV W/ CM
2 of 5 series · 17 of 46 positions shown, 19 images · IV contrast (water/omni  & 100 ML OMNI 300)
Comparison: CTs 10/27/2009 and 10/25/2009.

CLINICAL DATA: History diverticulitis with abscess formation.  The
patient has a drain. Assess for drain removal.

CT ABDOMEN AND PELVIS WITH CONTRAST
TECHNIQUE: Multidetector CT imaging of the abdomen and pelvis was
performed following the standard protocol during bolus
administration of intravenous contrast.
Contrast: 100 ml 1mnipaque-1VV IV and oral contrast media.

[Series 2: routine abdomen · axial · 0.86mm/px · z∈[-469,-59]mm · 14 of 92 slices shown, 16 images]
[im 5/92  soft-tissue]
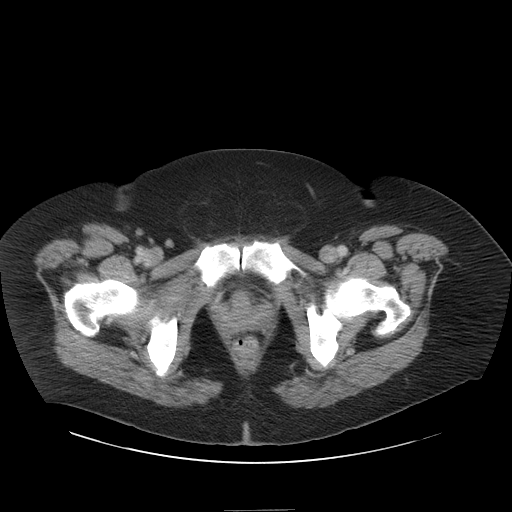
[im 5/92  bone]
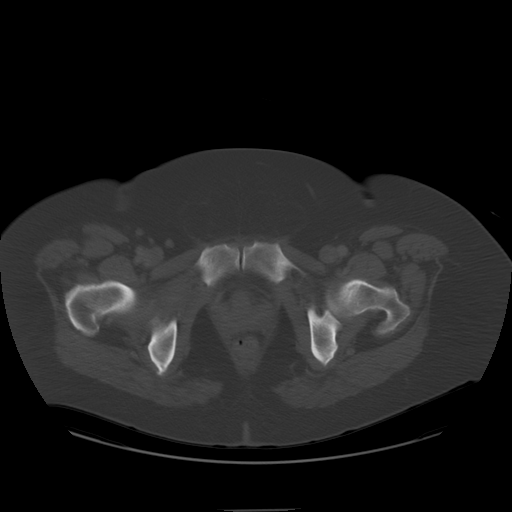
[im 14/92  soft-tissue]
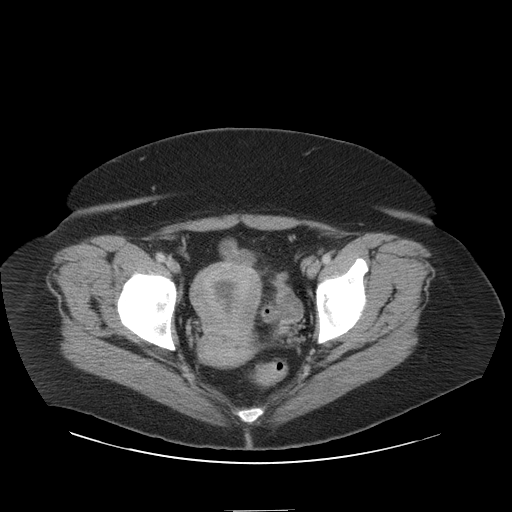
[im 19/92  soft-tissue]
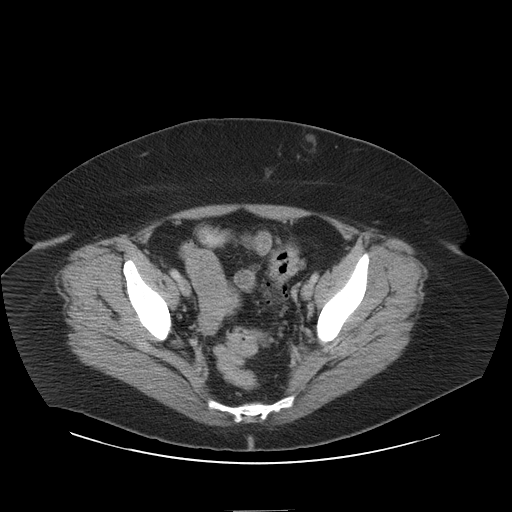
[im 23/92  soft-tissue]
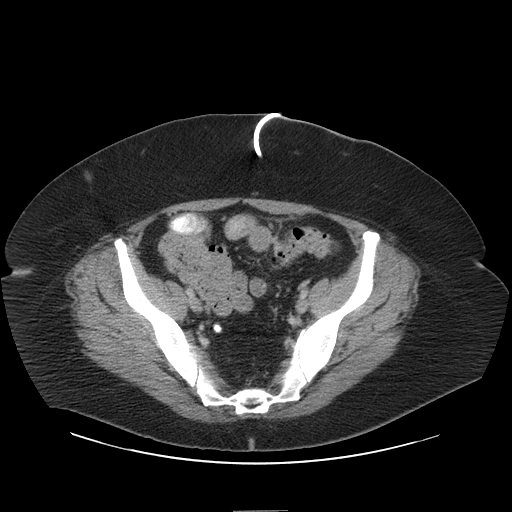
[im 32/92  soft-tissue]
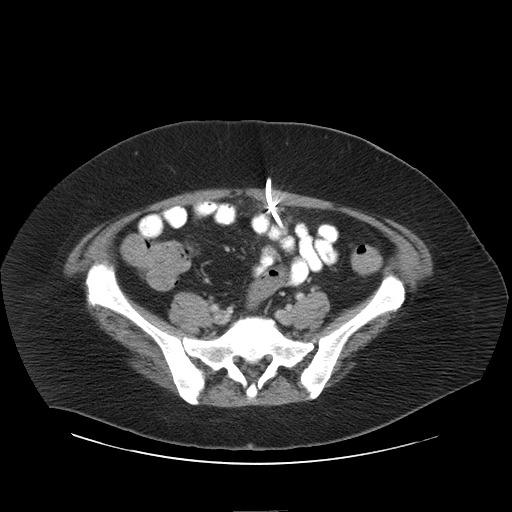
[im 37/92  soft-tissue]
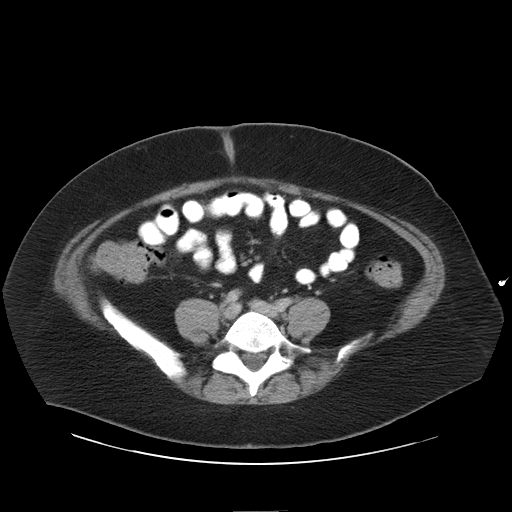
[im 41/92  soft-tissue]
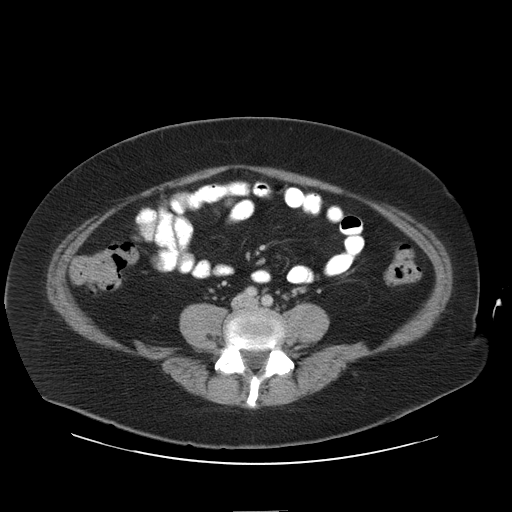
[im 51/92  soft-tissue]
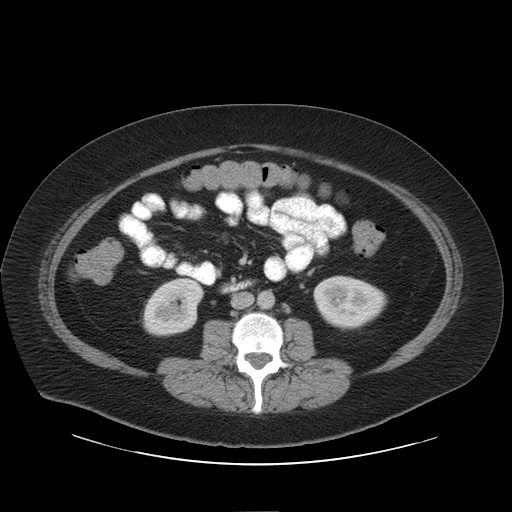
[im 55/92  soft-tissue]
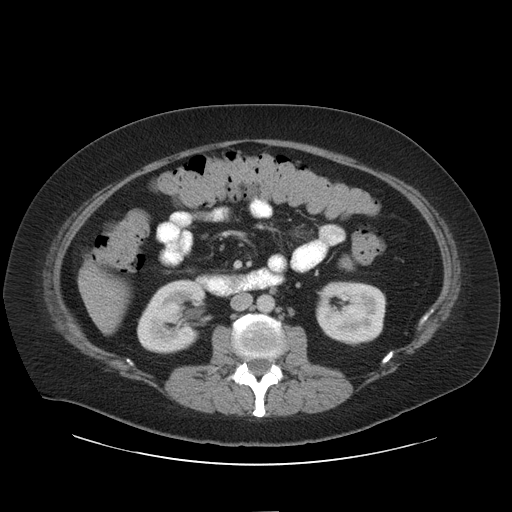
[im 55/92  bone]
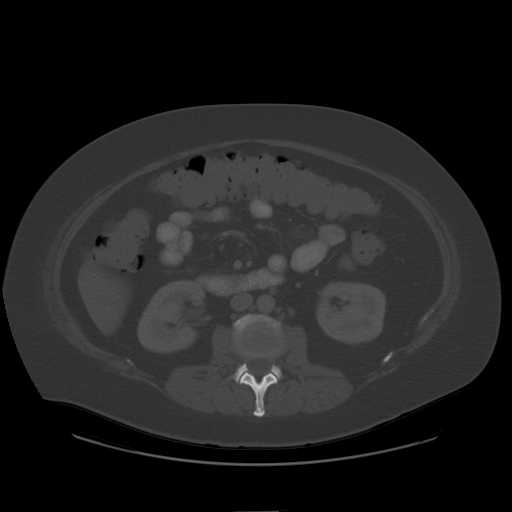
[im 60/92  soft-tissue]
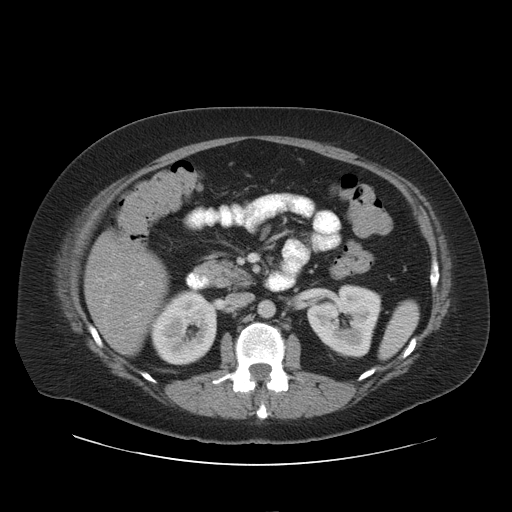
[im 69/92  soft-tissue]
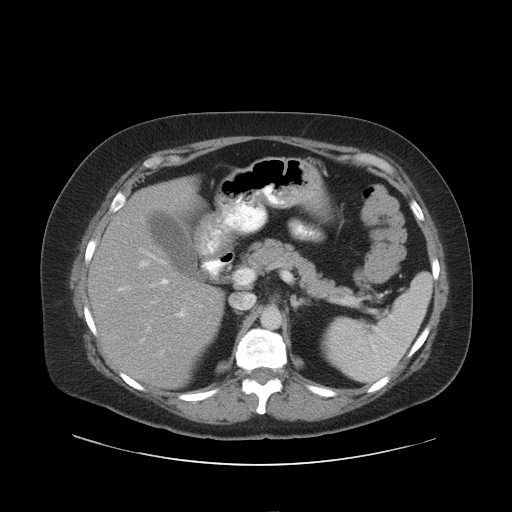
[im 73/92  soft-tissue]
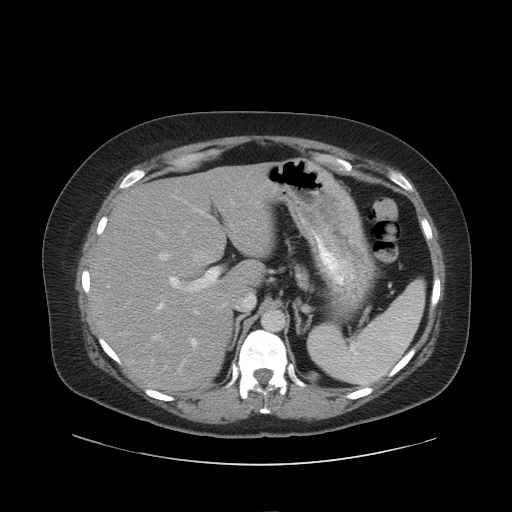
[im 78/92  soft-tissue]
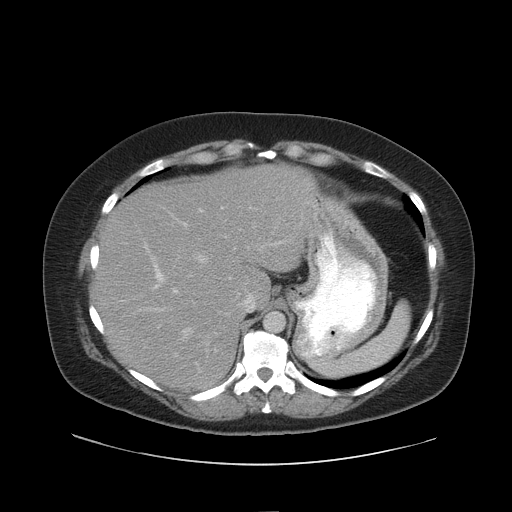
[im 87/92  soft-tissue]
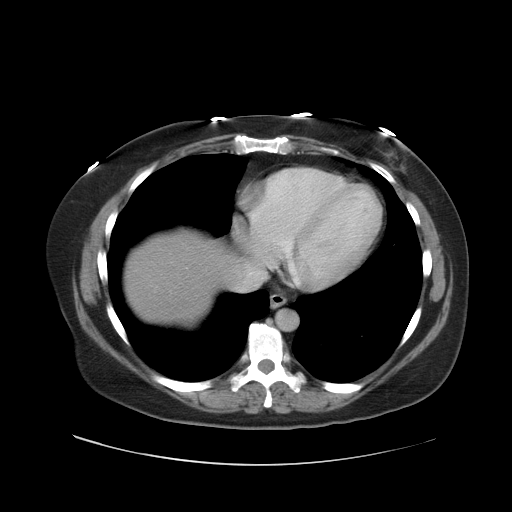

[Series 401: cor a/p · coronal · 0.90mm/px · 3 of 90 slices shown]
[im 30/90  soft-tissue]
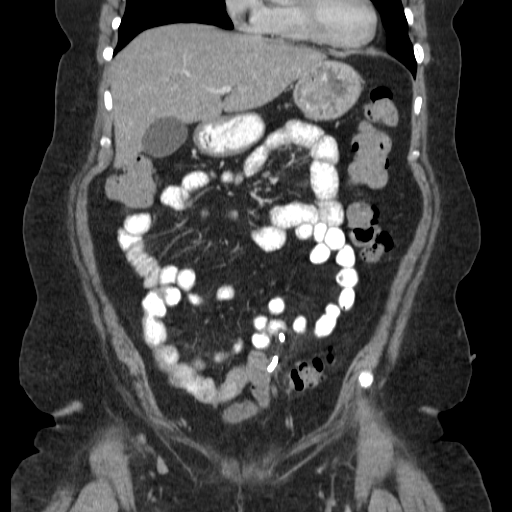
[im 40/90  soft-tissue]
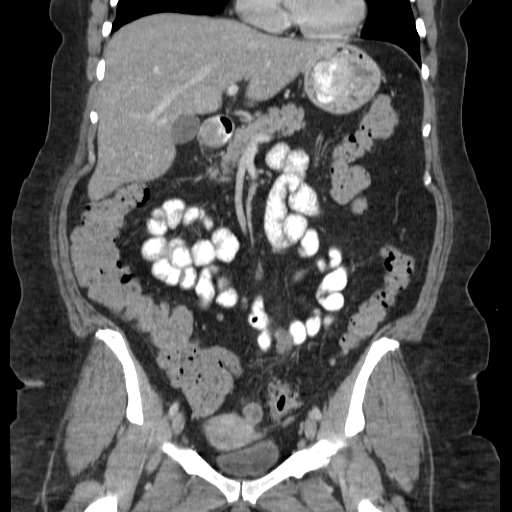
[im 50/90  soft-tissue]
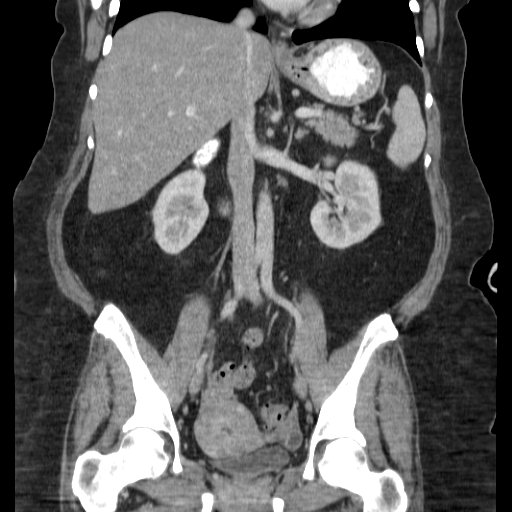

[17 of 46 positions shown; findings below may reference images not displayed]

FINDINGS: The previously noted anterior pelvic abscess has
resolved.  The radiopaque drain is still present.  There is a
decrease in soft tissue edema.  No free fluid or free air.

Incidentally, there is mild fatty infiltration of the liver.
Otherwise the liver appears unremarkable as does the spleen,
pancreas, adrenal glands, and kidneys.  Bony structures
unremarkable.

Incidental 1.8 cm left ovarian cyst are and possible recent rupture
of the cyst. Multiple sigmoid colon diverticula.
IMPRESSION: The anterior pelvic abscess has resolved post catheter therapy.

## 2012-01-05 ENCOUNTER — Ambulatory Visit: Payer: BC Managed Care – PPO | Admitting: Family Medicine

## 2012-01-09 ENCOUNTER — Ambulatory Visit: Payer: BC Managed Care – PPO | Admitting: Family Medicine

## 2012-01-09 DIAGNOSIS — Z0289 Encounter for other administrative examinations: Secondary | ICD-10-CM

## 2012-03-20 ENCOUNTER — Other Ambulatory Visit: Payer: Self-pay | Admitting: Oncology

## 2012-04-13 ENCOUNTER — Encounter: Payer: Self-pay | Admitting: Obstetrics and Gynecology

## 2012-04-13 ENCOUNTER — Ambulatory Visit (INDEPENDENT_AMBULATORY_CARE_PROVIDER_SITE_OTHER): Payer: BC Managed Care – PPO | Admitting: Obstetrics and Gynecology

## 2012-04-13 VITALS — BP 140/82 | HR 76 | Ht 69.0 in | Wt 235.0 lb

## 2012-04-13 DIAGNOSIS — Z8719 Personal history of other diseases of the digestive system: Secondary | ICD-10-CM

## 2012-04-13 DIAGNOSIS — Z01419 Encounter for gynecological examination (general) (routine) without abnormal findings: Secondary | ICD-10-CM

## 2012-04-13 DIAGNOSIS — K5792 Diverticulitis of intestine, part unspecified, without perforation or abscess without bleeding: Secondary | ICD-10-CM

## 2012-04-13 DIAGNOSIS — Z8619 Personal history of other infectious and parasitic diseases: Secondary | ICD-10-CM

## 2012-04-13 DIAGNOSIS — Z124 Encounter for screening for malignant neoplasm of cervix: Secondary | ICD-10-CM

## 2012-04-13 DIAGNOSIS — N926 Irregular menstruation, unspecified: Secondary | ICD-10-CM

## 2012-04-13 DIAGNOSIS — E785 Hyperlipidemia, unspecified: Secondary | ICD-10-CM | POA: Insufficient documentation

## 2012-04-13 DIAGNOSIS — M1990 Unspecified osteoarthritis, unspecified site: Secondary | ICD-10-CM

## 2012-04-13 DIAGNOSIS — K659 Peritonitis, unspecified: Secondary | ICD-10-CM

## 2012-04-13 DIAGNOSIS — N950 Postmenopausal bleeding: Secondary | ICD-10-CM

## 2012-04-13 DIAGNOSIS — I10 Essential (primary) hypertension: Secondary | ICD-10-CM

## 2012-04-13 DIAGNOSIS — E039 Hypothyroidism, unspecified: Secondary | ICD-10-CM

## 2012-04-13 DIAGNOSIS — N9489 Other specified conditions associated with female genital organs and menstrual cycle: Secondary | ICD-10-CM

## 2012-04-13 DIAGNOSIS — M129 Arthropathy, unspecified: Secondary | ICD-10-CM

## 2012-04-13 DIAGNOSIS — R102 Pelvic and perineal pain: Secondary | ICD-10-CM

## 2012-04-13 DIAGNOSIS — N949 Unspecified condition associated with female genital organs and menstrual cycle: Secondary | ICD-10-CM

## 2012-04-13 DIAGNOSIS — K5732 Diverticulitis of large intestine without perforation or abscess without bleeding: Secondary | ICD-10-CM

## 2012-04-13 DIAGNOSIS — C50919 Malignant neoplasm of unspecified site of unspecified female breast: Secondary | ICD-10-CM | POA: Insufficient documentation

## 2012-04-13 DIAGNOSIS — Z8742 Personal history of other diseases of the female genital tract: Secondary | ICD-10-CM

## 2012-04-13 LAB — POCT URINALYSIS DIPSTICK
Blood, UA: NEGATIVE
Ketones, UA: NEGATIVE
Protein, UA: NEGATIVE
Spec Grav, UA: 1.02

## 2012-04-13 NOTE — Progress Notes (Signed)
  Current contraception: none. Hormone replacement therapy: No New medication: No  History of ZOX:WRUE  History of infertility: no. History of abnormal Pap smear: no History of fibroids: No  Increased stress: No  Abnormal bleeding pattern started: PT HAD CYCLE IN 2011; NO CYCLE IN 2012;THEN HAD CYCLE IN JAN X 2 WEEKS;HAD CYCLE IN MARCH X 2 WEEKS;IN MAY AGAIN X 2 WEEKS; IN June X 2 WEEKS FOR 2013

## 2012-04-13 NOTE — Progress Notes (Signed)
Regular Periods: no Mammogram: yes  Monthly Breast Ex.: yes Exercise: yes  Tetanus < 10 years: yes Seatbelts: yes  NI. Bladder Functn.: yes Abuse at home: no  Daily BM's: yes Stressful Work: no  Healthy Diet: yes Sigmoid-Colonoscopy: 2011  Calcium: no Medical problems this year: irregular bleeding   LAST PAP:6/12  nl  Contraception: none  Mammogram:  07/2011  nkl  PCP: DR. Beverely Low  PMH: NO CHANGE  FMH: NO CHANGE  Last Bone Scan: NEVER

## 2012-04-13 NOTE — Progress Notes (Signed)
Subjective:    Catherine Castillo is a 51 y.o. female G0P0 who presents for annual exam. Patient is S/P Left Breast Cancer, is on Tamoxifen and has a history of thyroid disease.  Patient reports irregular bleeding. Had not bled in 13 months until January  bled x 4 days with pad change every 3-4 hours with wearing 2 forms of protection then spotting for about 1 week afterwards.  Same in March, May and June.  Admits to 3/10 cramps.  Denies urinary tract symptoms, changes in bowel movements, vaginitis symptoms or dyspareunia.  Last Pap: was normal  2012 Last mammogram: was normal  2012    Review of Systems Gastrointestinal:No change in bowel habits, no abdominal pain, no rectal bleeding Genitourinary:negative for dysuria, frequency, hematuria, nocturia and urinary incontinence   Objective:     BP 140/82  Pulse 76  Ht 5\' 9"  (1.753 m)  Wt 235 lb (106.595 kg)  BMI 34.70 kg/m2 Weight:  Wt Readings from Last 1 Encounters:  04/13/12 235 lb (106.595 kg)   Body mass index is 34.70 kg/(m^2). General Appearance:  Well nourished in no acute distress HEENT: Grossly normal Neck / Thyroid: Supple, no masses, nodes or enlargement Lungs: Clear to auscultation bilaterally Back: No CVA tenderness Breast Exam: No masses or nodes.No dimpling, nipple retraction or discharge. Cardiovascular: Regular rate and rhythm.  Gastrointestinal: Soft, non-tender, no masses or organomegaly Pelvic Exam: EGBUS-normal, vagina-pink mucosa without lesions, scant blood, cervix-no tenderness or lesions, uterus-appears normal size, shape and consistency, adnexae-no masses or tenderness Rectovaginal: Normal sphincter tone and  small hemorrhoid Lymphatic Exam: Non-palpable nodes in neck, clavicular, axillary, or inguinal regions  Skin: No rash or abnormalities Extremities: no clubbing cyanosis or edema Neurologic: Grossly normal Psychiatric: Alert and oriented x 3   UPT-negative U/A- SG 1.050, pH 5.0,  leuk-moderate Assessment:   Routine GYN Exam Post menopausal bleeding S/P Left Breast Cancer (Lumpectomy) Tamoxifen Therapy Thyroid Disease H/O Endometrial Polyp Pyuria   Plan:  Transvaginal ultrasound to measure endometrial stripe & follow up with Dr. Su Hilt.  Advised of possible endometrial biopsy  PAP sent  Urine for culture  RTO as scheduled  Reviewed revised guidelines for PAP smear maintenance schedule-patient wants yearly  Jymir Dunaj,ELMIRAPA-C

## 2012-04-17 LAB — PAP IG W/ RFLX HPV ASCU

## 2012-05-07 ENCOUNTER — Ambulatory Visit (INDEPENDENT_AMBULATORY_CARE_PROVIDER_SITE_OTHER): Payer: BC Managed Care – PPO | Admitting: Obstetrics and Gynecology

## 2012-05-07 ENCOUNTER — Ambulatory Visit (INDEPENDENT_AMBULATORY_CARE_PROVIDER_SITE_OTHER): Payer: BC Managed Care – PPO

## 2012-05-07 ENCOUNTER — Encounter: Payer: Self-pay | Admitting: Obstetrics and Gynecology

## 2012-05-07 VITALS — BP 136/88 | Resp 16 | Ht 69.0 in | Wt 235.0 lb

## 2012-05-07 DIAGNOSIS — C50919 Malignant neoplasm of unspecified site of unspecified female breast: Secondary | ICD-10-CM

## 2012-05-07 DIAGNOSIS — N950 Postmenopausal bleeding: Secondary | ICD-10-CM

## 2012-05-07 NOTE — Progress Notes (Signed)
Here to f/u u/s secondary to PMB  Filed Vitals:   05/07/12 0907  BP: 136/88  Resp: 16   Ultrasound today uterus 9.2 x 5.2 cm an endometrial stripe measures 0.3 cm her bilateral ovaries are within normal limits there is several small fibroids a right anterior fibroid measuring 1.7 cm and a right posterior fibroid measuring 1.4 cm in the mid uterus there is a question of a small 1 cm some mucosal fibroid vs. Polyp vs. Tamoxifen changes  A/P PMB Reviewed results of u/s - options and recs discussed Will schedule hyst/D&C/poss resection

## 2012-05-15 ENCOUNTER — Other Ambulatory Visit: Payer: Self-pay | Admitting: Obstetrics and Gynecology

## 2012-05-15 ENCOUNTER — Telehealth: Payer: Self-pay | Admitting: Obstetrics and Gynecology

## 2012-05-15 NOTE — Telephone Encounter (Signed)
Hysteroscopy D&C; Possible Resection scheduled for 05/29/12 @ 11:30 with [place]. BCBS effective 10/18/11.  Plan pays 80/20 after a $200 deductible.  Pre-op due $78.66.  -Adrianne Pridgen

## 2012-05-16 ENCOUNTER — Encounter (HOSPITAL_COMMUNITY): Payer: Self-pay | Admitting: Pharmacist

## 2012-05-17 HISTORY — PX: COMBINED HYSTEROSCOPY DIAGNOSTIC / D&C: SUR297

## 2012-05-21 ENCOUNTER — Encounter (HOSPITAL_COMMUNITY)
Admission: RE | Admit: 2012-05-21 | Discharge: 2012-05-21 | Disposition: A | Discharge status: Home / Self Care | Payer: BC Managed Care – PPO | Source: Ambulatory Visit | Attending: Obstetrics and Gynecology | Admitting: Obstetrics and Gynecology

## 2012-05-21 ENCOUNTER — Encounter (HOSPITAL_COMMUNITY): Payer: Self-pay

## 2012-05-21 LAB — CBC
MCH: 26.5 pg (ref 26.0–34.0)
MCHC: 31.4 g/dL (ref 30.0–36.0)
MCV: 84.3 fL (ref 78.0–100.0)
Platelets: 338 10*3/uL (ref 150–400)
RDW: 13.7 % (ref 11.5–15.5)

## 2012-05-21 LAB — BASIC METABOLIC PANEL
BUN: 17 mg/dL (ref 6–23)
GFR calc Af Amer: >90 mL/min (ref >90)
Sodium: 137 mEq/L (ref 135–145)

## 2012-05-21 NOTE — Patient Instructions (Addendum)
Your procedure is scheduled on:05/29/12  Enter through the Main Entrance at :10 am Pick up desk phone and dial [phone] and inform us of your arrival.  Please call [phone] if you have any problems the morning of surgery.  Remember: Do not eat after midnight:Monday Do not drink after:7am Tuesday  Take these meds the morning of surgery with a sip of water:Synthroid, Lisinopril  DO NOT wear jewelry, eye make-up, lipstick,body lotion, or dark fingernail polish. Do not shave for 48 hours prior to surgery.   Patients discharged on the day of surgery will not be allowed to drive home.   Remember to use your Hibiclens as instructed.

## 2012-05-22 ENCOUNTER — Ambulatory Visit (INDEPENDENT_AMBULATORY_CARE_PROVIDER_SITE_OTHER): Payer: BC Managed Care – PPO | Admitting: Obstetrics and Gynecology

## 2012-05-22 ENCOUNTER — Encounter: Payer: Self-pay | Admitting: Obstetrics and Gynecology

## 2012-05-22 VITALS — BP 118/72 | HR 78 | Temp 98.1°F | Resp 16 | Ht 69.0 in | Wt 235.0 lb

## 2012-05-22 DIAGNOSIS — R350 Frequency of micturition: Secondary | ICD-10-CM

## 2012-05-22 DIAGNOSIS — N950 Postmenopausal bleeding: Secondary | ICD-10-CM

## 2012-05-22 DIAGNOSIS — Z01818 Encounter for other preprocedural examination: Secondary | ICD-10-CM

## 2012-05-22 LAB — POCT URINALYSIS DIPSTICK
Bilirubin, UA: NEGATIVE
Ketones, UA: NEGATIVE
Leukocytes, UA: NEGATIVE
Protein, UA: NEGATIVE

## 2012-05-22 NOTE — Addendum Note (Signed)
Addended by: Osborn Coho on: 05/22/2012 10:13 PM   Modules accepted: Orders

## 2012-05-22 NOTE — Progress Notes (Signed)
Catherine Castillo is a 51 y.o. female G0P0 who presents for hysteroscopy, dilatation, curettage and possible resection because of post-menopausal bleeding.  Patient was period-free for  fourteen months until she began to bleed in January and March of this year and twice in May.  Bleeding ranged from dark spotting for 2 weeks to 4 days of bleeding that required a pad with a tampon change every 2 hours (with occasional spillage).  She admits to significant cramping and urinary frequency but denies generalized pelvic pain, dysuria, hematuria, changes in bowel movements or vaginitis symptoms.   Patient has been on Tamoxifen for the past four years due to her history of left breast cancer. A pelvic ultrasound done in July showed: uterus 9.27  x 5.29 cm with an endometrial stripe=0.3 cm; both ovaries appeared normal and several small fibroids < 2 cm were noted.  There was  a question of a small 1 cm sub-mucosal fibroid vs. Polyp vs. Tamoxifen changes also noted.  Given patient's history of endometrial polyps, Tamoxifen therapy and post-menopausal bleeding she has decided to proceed with hysteroscopy, dilatation, curettage and possible resection of an endometrial mass.      Past Medical History  OB History: G0P0   GYN History: menarche 51 YO; post-menopausal;    The patient denies history of sexually transmitted disease.  Denies history of abnormal PAP smear  Last PAP smear 03/2012  Medical History: diverticulosis, elevated lipids, peritonitis due to colon rupture, migraines, hypertension, hypothyroid, left breast cancer  Surgical History:  2008 Bilateral Knee Replacement;  2010 Hysteroscopy/D&C with polypectomy;  2010 Left Breast Lumpectomy due to cancer;  2011 Partial                               Colectomy.          Denies problems with anesthesia   Family History:  colon cancer, breast cancer, stroke, hypertension,  lung cancer, and diabetes  Social History:  Single, retired but in school for nursing;  Denies tobacco or illicit drug use, admits to occasional                             alcohol use.   Outpatient Encounter Prescriptions as of 05/22/2012  Medication Sig Dispense Refill  . atorvastatin (LIPITOR) 40 MG tablet Take 1 tablet (40 mg total) by mouth at bedtime.  90 tablet  3  . levothyroxine (SYNTHROID, LEVOTHROID) 200 MCG tablet Take 1 tablet (200 mcg total) by mouth daily.  90 tablet  3  . lisinopril (PRINIVIL,ZESTRIL) 10 MG tablet Take 20 mg by mouth daily.      . Multiple Vitamin (MULTIVITAMIN WITH MINERALS) TABS Take 1 tablet by mouth daily.      . Probiotic Product (ALIGN) 4 MG CAPS Take by mouth daily.        . tamoxifen (NOLVADEX) 20 MG tablet TAKE 1 TABLET DAILY  90 tablet  3    Allergies  Allergen Reactions  . Amoxicillin Hives  . Penicillins Hives  Denies sensitivity to shellfish, peanuts, soy, adhesives or soy.  ROS: Admits to contact lenses and urinary frequency;  Denies headache, vision changes, dysphagia, tinnitus, dizziness,  chest pain, shortness of breath, nausea, vomiting, diarrhea, dysuria, hematuria, pelvic pain except with bleeding, swelling of joints,easy bruising,  myalgias, arthralgias, skin rashes and except as is mentioned in the history of present illness, patient's review of systems is  otherwise negative  Physical Exam    BP 118/72  Pulse 78  Temp 98.1 F (36.7 C) (Oral)  Resp 16  Ht 5\' 9"  (1.753 m)  Wt 235 lb (106.595 kg)  BMI 34.70 kg/m2  Neck: supple without masses or thyromegaly Lungs: clear to auscultation Heart: regular rate and rhythm Abdomen: soft, non-tender and no organomegaly Pelvic:EGBUS- wnl; vagina-normal rugae; uterus-normal size, cervix without lesions or motion tenderness; adnexae-no tenderness or masses Extremities:  no clubbing, cyanosis or edema  U/A-negative   Assesment: Post-menopausal bleeding   Disposition:  A discussion was held with patient regarding the indication for her procedure(s) along with the risks,  which include but are not limited to: reaction to anesthesia, damage to adjacent organs, infection and excessive bleeding. Patient verbalized understanding of these risks and has consented to proceed with a Hysteroscopy, Dilatation, Curettage and Possible Resection at [facility] [place], June 03, 2012 at 11:30 a.,.   CSN# [id number]   Shella Lahman J. Lowell Guitar, PA-C  for Dr. Woodroe Mode.Su Hilt

## 2012-05-22 NOTE — H&P (Signed)
Catherine Castillo is a 51 y.o. female G0P0 who presents for hysteroscopy, dilatation, curettage and possible resection because of post-menopausal bleeding.  Patient was period-free for  fourteen months until she began to bleed in January and March of this year and twice in May.  Bleeding ranged from dark spotting for 2 weeks to 4 days of bleeding that required a pad with a tampon change every 2 hours (with occasional spillage).  She admits to significant cramping and urinary frequency but denies generalized pelvic pain, dysuria, hematuria, changes in bowel movements or vaginitis symptoms.   Patient has been on Tamoxifen for the past four years due to her history of left breast cancer. A pelvic ultrasound done in July showed: uterus 9.27  x 5.29 cm with an endometrial stripe=0.3 cm; both ovaries appeared normal and several small fibroids < 2 cm were noted.  There was  a question of a small 1 cm sub-mucosal fibroid vs. Polyp vs. Tamoxifen changes also noted.  Given patient's history of endometrial polyps, Tamoxifen therapy and post-menopausal bleeding she has decided to proceed with hysteroscopy, dilatation, curettage and possible resection of an endometrial mass.      Past Medical History  OB History: G0P0   GYN History: menarche 51 YO; post-menopausal;    The patient denies history of sexually transmitted disease.  Denies history of abnormal PAP smear  Last PAP smear 03/2012  Medical History: diverticulosis, elevated lipids, peritonitis due to colon rupture, migraines, hypertension, hypothyroid, left breast cancer  Surgical History:  2008 Bilateral Knee Replacement;  2010 Hysteroscopy/D&C with polypectomy;  2010 Left Breast Lumpectomy due to cancer;  2011 Partial                               Colectomy.          Denies problems with anesthesia   Family History:  colon cancer, breast cancer, stroke, hypertension,  lung cancer, and diabetes  Social History:  Single, retired but in school for nursing;  Denies tobacco or illicit drug use, admits to occasional                             alcohol use.   Outpatient Encounter Prescriptions as of 05/22/2012  Medication Sig Dispense Refill  . atorvastatin (LIPITOR) 40 MG tablet Take 1 tablet (40 mg total) by mouth at bedtime.  90 tablet  3  . levothyroxine (SYNTHROID, LEVOTHROID) 200 MCG tablet Take 1 tablet (200 mcg total) by mouth daily.  90 tablet  3  . lisinopril (PRINIVIL,ZESTRIL) 10 MG tablet Take 20 mg by mouth daily.      . Multiple Vitamin (MULTIVITAMIN WITH MINERALS) TABS Take 1 tablet by mouth daily.      . Probiotic Product (ALIGN) 4 MG CAPS Take by mouth daily.        . tamoxifen (NOLVADEX) 20 MG tablet TAKE 1 TABLET DAILY  90 tablet  3    Allergies  Allergen Reactions  . Amoxicillin Hives  . Penicillins Hives  Denies sensitivity to shellfish, peanuts, soy, adhesives or soy.  ROS: Admits to contact lenses and urinary frequency;  Denies headache, vision changes, dysphagia, tinnitus, dizziness,  chest pain, shortness of breath, nausea, vomiting, diarrhea, dysuria, hematuria, pelvic pain except with bleeding, swelling of joints,easy bruising,  myalgias, arthralgias, skin rashes and except as is mentioned in the history of present illness, patient's review of systems is   otherwise negative  Physical Exam    BP 118/72  Pulse 78  Temp 98.1 F (36.7 C) (Oral)  Resp 16  Ht 5' 9" (1.753 m)  Wt 235 lb (106.595 kg)  BMI 34.70 kg/m2  Neck: supple without masses or thyromegaly Lungs: clear to auscultation Heart: regular rate and rhythm Abdomen: soft, non-tender and no organomegaly Pelvic:EGBUS- wnl; vagina-normal rugae; uterus-normal size, cervix without lesions or motion tenderness; adnexae-no tenderness or masses Extremities:  no clubbing, cyanosis or edema  U/A-negative   Assesment: Post-menopausal bleeding   Disposition:  A discussion was held with patient regarding the indication for her procedure(s) along with the risks,  which include but are not limited to: reaction to anesthesia, damage to adjacent organs, infection and excessive bleeding. Patient verbalized understanding of these risks and has consented to proceed with a Hysteroscopy, Dilatation, Curettage and Possible Resection at [facility], June 03, 2012 at 11:30 a.,.   CSN# [id number]   Cypress Hinkson J. Oniyah Rohe, PA-C  for Dr. Angela Y.Roberts   

## 2012-05-29 ENCOUNTER — Encounter (HOSPITAL_COMMUNITY): Payer: Self-pay | Admitting: Anesthesiology

## 2012-05-29 ENCOUNTER — Encounter (HOSPITAL_COMMUNITY)
Admission: RE | Disposition: A | Discharge status: Home / Self Care | Payer: Self-pay | Source: Ambulatory Visit | Attending: Obstetrics and Gynecology

## 2012-05-29 ENCOUNTER — Ambulatory Visit (HOSPITAL_COMMUNITY)
Admission: RE | Admit: 2012-05-29 | Discharge: 2012-05-29 | Disposition: A | Discharge status: Home / Self Care | Payer: BC Managed Care – PPO | Source: Ambulatory Visit | Attending: Obstetrics and Gynecology | Admitting: Obstetrics and Gynecology

## 2012-05-29 ENCOUNTER — Ambulatory Visit (HOSPITAL_COMMUNITY): Payer: BC Managed Care – PPO | Admitting: Anesthesiology

## 2012-05-29 ENCOUNTER — Encounter (HOSPITAL_COMMUNITY): Payer: Self-pay | Admitting: *Deleted

## 2012-05-29 DIAGNOSIS — D259 Leiomyoma of uterus, unspecified: Secondary | ICD-10-CM | POA: Insufficient documentation

## 2012-05-29 DIAGNOSIS — Z01812 Encounter for preprocedural laboratory examination: Secondary | ICD-10-CM | POA: Insufficient documentation

## 2012-05-29 DIAGNOSIS — N950 Postmenopausal bleeding: Secondary | ICD-10-CM | POA: Insufficient documentation

## 2012-05-29 DIAGNOSIS — Z01818 Encounter for other preprocedural examination: Secondary | ICD-10-CM | POA: Insufficient documentation

## 2012-05-29 LAB — HCG, SERUM, QUALITATIVE: Preg, Serum: NEGATIVE

## 2012-05-29 LAB — GLUCOSE, CAPILLARY: Glucose-Capillary: 125 mg/dL — ABNORMAL HIGH (ref 70–99)

## 2012-05-29 SURGERY — DILATATION & CURETTAGE/HYSTEROSCOPY WITH RESECTOCOPE
Anesthesia: Choice | Site: Uterus | Wound class: Clean Contaminated

## 2012-05-29 MED ORDER — LACTATED RINGERS IV SOLN
INTRAVENOUS | Status: DC
  Administered 2012-05-29: 11:00:00 via INTRAVENOUS

## 2012-05-29 MED ORDER — LACTATED RINGERS IV SOLN
INTRAVENOUS | Status: DC | PRN
  Administered 2012-05-29 (×2): via INTRAVENOUS

## 2012-05-29 MED ORDER — FENTANYL CITRATE 0.05 MG/ML IJ SOLN
25.0000 ug | INTRAMUSCULAR | Status: DC | PRN
  Administered 2012-05-29: 50 ug via INTRAVENOUS

## 2012-05-29 MED ORDER — PROPOFOL 10 MG/ML IV EMUL
INTRAVENOUS | Status: AC
  Filled 2012-05-29: qty 20

## 2012-05-29 MED ORDER — DEXAMETHASONE SODIUM PHOSPHATE 4 MG/ML IJ SOLN
INTRAMUSCULAR | Status: DC | PRN
  Administered 2012-05-29: 5 mg via INTRAVENOUS

## 2012-05-29 MED ORDER — MIDAZOLAM HCL 5 MG/5ML IJ SOLN
INTRAMUSCULAR | Status: DC | PRN
  Administered 2012-05-29: 2 mg via INTRAVENOUS

## 2012-05-29 MED ORDER — KETOROLAC TROMETHAMINE 30 MG/ML IJ SOLN
INTRAMUSCULAR | Status: DC | PRN
  Administered 2012-05-29: 30 mg via INTRAVENOUS

## 2012-05-29 MED ORDER — MEPERIDINE HCL 25 MG/ML IJ SOLN: 6.2500 mg | INTRAMUSCULAR | Status: DC | PRN

## 2012-05-29 MED ORDER — DEXAMETHASONE SODIUM PHOSPHATE 10 MG/ML IJ SOLN
INTRAMUSCULAR | Status: AC
  Filled 2012-05-29: qty 1

## 2012-05-29 MED ORDER — FENTANYL CITRATE 0.05 MG/ML IJ SOLN
INTRAMUSCULAR | Status: DC | PRN
  Administered 2012-05-29 (×2): 50 ug via INTRAVENOUS

## 2012-05-29 MED ORDER — IBUPROFEN 600 MG PO TABS: 600.0000 mg | ORAL_TABLET | Freq: Four times a day (QID) | ORAL | Status: AC | PRN

## 2012-05-29 MED ORDER — ONDANSETRON HCL 4 MG/2ML IJ SOLN
INTRAMUSCULAR | Status: AC
  Filled 2012-05-29: qty 2

## 2012-05-29 MED ORDER — LIDOCAINE HCL 1 % IJ SOLN
INTRAMUSCULAR | Status: DC | PRN
  Administered 2012-05-29: 9 mL

## 2012-05-29 MED ORDER — MIDAZOLAM HCL 2 MG/2ML IJ SOLN
INTRAMUSCULAR | Status: AC
  Filled 2012-05-29: qty 2

## 2012-05-29 MED ORDER — METOCLOPRAMIDE HCL 5 MG/ML IJ SOLN: 10.0000 mg | Freq: Once | INTRAMUSCULAR | Status: DC | PRN

## 2012-05-29 MED ORDER — FENTANYL CITRATE 0.05 MG/ML IJ SOLN
INTRAMUSCULAR | Status: AC
  Filled 2012-05-29: qty 2

## 2012-05-29 MED ORDER — KETOROLAC TROMETHAMINE 30 MG/ML IJ SOLN
INTRAMUSCULAR | Status: AC
  Filled 2012-05-29: qty 1

## 2012-05-29 MED ORDER — GLYCINE 1.5 % IR SOLN
Status: DC | PRN
  Administered 2012-05-29: 3000 mL

## 2012-05-29 MED ORDER — HYDROCODONE-ACETAMINOPHEN 5-500 MG PO TABS: 1.0000 | ORAL_TABLET | Freq: Four times a day (QID) | ORAL | Status: AC | PRN

## 2012-05-29 MED ORDER — PROPOFOL 10 MG/ML IV EMUL
INTRAVENOUS | Status: DC | PRN
  Administered 2012-05-29: 200 mg via INTRAVENOUS

## 2012-05-29 MED ORDER — ONDANSETRON HCL 4 MG/2ML IJ SOLN
INTRAMUSCULAR | Status: DC | PRN
  Administered 2012-05-29: 4 mg via INTRAVENOUS

## 2012-05-29 MED ORDER — LIDOCAINE HCL (CARDIAC) 20 MG/ML IV SOLN
INTRAVENOUS | Status: DC | PRN
  Administered 2012-05-29: 60 mg via INTRAVENOUS

## 2012-05-29 MED ORDER — LIDOCAINE HCL (CARDIAC) 20 MG/ML IV SOLN
INTRAVENOUS | Status: AC
  Filled 2012-05-29: qty 5

## 2012-05-29 MED ORDER — FENTANYL CITRATE 0.05 MG/ML IJ SOLN
INTRAMUSCULAR | Status: AC
  Administered 2012-05-29: 50 ug via INTRAVENOUS
  Filled 2012-05-29: qty 2

## 2012-05-29 SURGICAL SUPPLY — 15 items
CANISTER SUCTION 2500CC (MISCELLANEOUS) ×2 IMPLANT
CATH ROBINSON RED A/P 16FR (CATHETERS) ×2 IMPLANT
CLOTH BEACON ORANGE TIMEOUT ST (SAFETY) ×2 IMPLANT
CONTAINER PREFILL 10% NBF 60ML (FORM) ×4 IMPLANT
ELECT REM PT RETURN 9FT ADLT (ELECTROSURGICAL) ×2
ELECTRODE REM PT RTRN 9FT ADLT (ELECTROSURGICAL) ×1 IMPLANT
GLOVE BIO SURGEON STRL SZ7.5 (GLOVE) ×4 IMPLANT
GLOVE BIOGEL PI IND STRL 7.5 (GLOVE) ×1 IMPLANT
GLOVE BIOGEL PI INDICATOR 7.5 (GLOVE) ×1
GOWN PREVENTION PLUS LG XLONG (DISPOSABLE) ×2 IMPLANT
GOWN STRL REIN XL XLG (GOWN DISPOSABLE) ×2 IMPLANT
LOOP ANGLED CUTTING 22FR (CUTTING LOOP) IMPLANT
PACK HYSTEROSCOPY LF (CUSTOM PROCEDURE TRAY) ×2 IMPLANT
TOWEL OR 17X24 6PK STRL BLUE (TOWEL DISPOSABLE) ×4 IMPLANT
WATER STERILE IRR 1000ML POUR (IV SOLUTION) ×2 IMPLANT

## 2012-05-29 NOTE — Anesthesia Preprocedure Evaluation (Addendum)
Anesthesia Evaluation  Patient identified by MRN, date of birth, ID band Patient awake    Reviewed: Allergy & Precautions, H&P , NPO status , Patient's Chart, lab work & pertinent test results  Airway Mallampati: III TM Distance: >3 FB Neck ROM: Full    Dental No notable dental hx. (+) Teeth Intact   Pulmonary neg pulmonary ROS,  breath sounds clear to auscultation  Pulmonary exam normal       Cardiovascular hypertension, Pt. on medications Rhythm:Regular Rate:Normal     Neuro/Psych  Headaches, negative psych ROS   GI/Hepatic negative GI ROS, Neg liver ROS,   Endo/Other  Well Controlled, Type 2, Oral Hypoglycemic AgentsHypothyroidism Hyperlipidemia Hx/o Breast Ca  Renal/GU negative Renal ROS  negative genitourinary   Musculoskeletal  (+) Arthritis -, Osteoarthritis,    Abdominal (+) + obese,   Peds  Hematology   Anesthesia Other Findings   Reproductive/Obstetrics Post Menopausal Bleeding                          Anesthesia Physical Anesthesia Plan  ASA: II  Anesthesia Plan: General   Post-op Pain Management:    Induction: Intravenous  Airway Management Planned: LMA  Additional Equipment:   Intra-op Plan:   Post-operative Plan: Extubation in OR  Informed Consent: I have reviewed the patients History and Physical, chart, labs and discussed the procedure including the risks, benefits and alternatives for the proposed anesthesia with the patient or authorized representative who has indicated his/her understanding and acceptance.   Dental advisory given  Plan Discussed with: CRNA, Anesthesiologist and Surgeon  Anesthesia Plan Comments:         Anesthesia Quick Evaluation

## 2012-05-29 NOTE — H&P (View-Only) (Signed)
Catherine Castillo is a 51 y.o. female G0P0 who presents for hysteroscopy, dilatation, curettage and possible resection because of post-menopausal bleeding.  Patient was period-free for  fourteen months until she began to bleed in January and March of this year and twice in May.  Bleeding ranged from dark spotting for 2 weeks to 4 days of bleeding that required a pad with a tampon change every 2 hours (with occasional spillage).  She admits to significant cramping and urinary frequency but denies generalized pelvic pain, dysuria, hematuria, changes in bowel movements or vaginitis symptoms.   Patient has been on Tamoxifen for the past four years due to her history of left breast cancer. A pelvic ultrasound done in July showed: uterus 9.27  x 5.29 cm with an endometrial stripe=0.3 cm; both ovaries appeared normal and several small fibroids < 2 cm were noted.  There was  a question of a small 1 cm sub-mucosal fibroid vs. Polyp vs. Tamoxifen changes also noted.  Given patient's history of endometrial polyps, Tamoxifen therapy and post-menopausal bleeding she has decided to proceed with hysteroscopy, dilatation, curettage and possible resection of an endometrial mass.      Past Medical History  OB History: G0P0   GYN History: menarche 51 YO; post-menopausal;    The patient denies history of sexually transmitted disease.  Denies history of abnormal PAP smear  Last PAP smear 03/2012  Medical History: diverticulosis, elevated lipids, peritonitis due to colon rupture, migraines, hypertension, hypothyroid, left breast cancer  Surgical History:  2008 Bilateral Knee Replacement;  2010 Hysteroscopy/D&C with polypectomy;  2010 Left Breast Lumpectomy due to cancer;  2011 Partial                               Colectomy.          Denies problems with anesthesia   Family History:  colon cancer, breast cancer, stroke, hypertension,  lung cancer, and diabetes  Social History:  Single, retired but in school for nursing;  Denies tobacco or illicit drug use, admits to occasional                             alcohol use.   Outpatient Encounter Prescriptions as of 05/22/2012  Medication Sig Dispense Refill  . atorvastatin (LIPITOR) 40 MG tablet Take 1 tablet (40 mg total) by mouth at bedtime.  90 tablet  3  . levothyroxine (SYNTHROID, LEVOTHROID) 200 MCG tablet Take 1 tablet (200 mcg total) by mouth daily.  90 tablet  3  . lisinopril (PRINIVIL,ZESTRIL) 10 MG tablet Take 20 mg by mouth daily.      . Multiple Vitamin (MULTIVITAMIN WITH MINERALS) TABS Take 1 tablet by mouth daily.      . Probiotic Product (ALIGN) 4 MG CAPS Take by mouth daily.        . tamoxifen (NOLVADEX) 20 MG tablet TAKE 1 TABLET DAILY  90 tablet  3    Allergies  Allergen Reactions  . Amoxicillin Hives  . Penicillins Hives  Denies sensitivity to shellfish, peanuts, soy, adhesives or soy.  ROS: Admits to contact lenses and urinary frequency;  Denies headache, vision changes, dysphagia, tinnitus, dizziness,  chest pain, shortness of breath, nausea, vomiting, diarrhea, dysuria, hematuria, pelvic pain except with bleeding, swelling of joints,easy bruising,  myalgias, arthralgias, skin rashes and except as is mentioned in the history of present illness, patient's review of systems is  otherwise negative  Physical Exam    BP 118/72  Pulse 78  Temp 98.1 F (36.7 C) (Oral)  Resp 16  Ht 5\' 9"  (1.753 m)  Wt 235 lb (106.595 kg)  BMI 34.70 kg/m2  Neck: supple without masses or thyromegaly Lungs: clear to auscultation Heart: regular rate and rhythm Abdomen: soft, non-tender and no organomegaly Pelvic:EGBUS- wnl; vagina-normal rugae; uterus-normal size, cervix without lesions or motion tenderness; adnexae-no tenderness or masses Extremities:  no clubbing, cyanosis or edema  U/A-negative   Assesment: Post-menopausal bleeding   Disposition:  A discussion was held with patient regarding the indication for her procedure(s) along with the risks,  which include but are not limited to: reaction to anesthesia, damage to adjacent organs, infection and excessive bleeding. Patient verbalized understanding of these risks and has consented to proceed with a Hysteroscopy, Dilatation, Curettage and Possible Resection at [facility], June 03, 2012 at 11:30 a.,.   CSN# [id number]   Ordean Fouts J. Lowell Guitar, PA-C  for Dr. Woodroe Mode.Su Hilt

## 2012-05-29 NOTE — Op Note (Signed)
Preop Diagnosis: Post Menopausal Bleeding   Postop Diagnosis: Post Menopausal Bleeding   Procedure: DILATATION & CURETTAGE/HYSTEROSCOPY WITH RESECTOCOPE   Anesthesia: Choice   Anesthesiologist: Casimiro Needle A. Malen Gauze, MD   Attending: Purcell Nails, MD   Assistant: N/a  Findings: Small approx 1cm fibroid on posterior wall  Pathology: 1.Endometrial Currettings and questionable vaginal polyp 2.Resection of fibroid  Fluids: 2700 cc  UOP: 150 cc via straight cath prior to procedure  EBL: Minimal  Complications: None  Procedure:The patient was taken to the operating room after the risks, benefits and alternatives were discussed with the patient. The patient verbalized understanding and consent signed and witnessed. The patient was placed under general anesthesia with an LMA per anesthesiologist and prepped and draped in the normal sterile fashion.  Time Out was performed per protocol.  A bivalve speculum was placed in the patient's vagina and the anterior lip of the cervix was grasped with a single tooth tenaculum. A paracervical block was administered using a total of 10 cc of 1% lidocaine. The uterus sounded to 10 cm. The cervix was dilated for passage of the hysteroscope.  The hysteroscope was introduced into the uterine cavity and findings as noted above. Sharp curettage was performed until a gritty texture was noted and curettings were sent to pathology. The hysteroscope was reintroduced and no obvious remaining intracavitary lesions were noted.  All instruments were removed. Sponge lap and needle count was correct. The patient tolerated the procedure well and was returned to the recovery room in good condition.

## 2012-05-29 NOTE — Transfer of Care (Signed)
Immediate Anesthesia Transfer of Care Note  Patient: Catherine Castillo  Procedure(s) Performed: Procedure(s) (LRB): DILATATION & CURETTAGE/HYSTEROSCOPY WITH RESECTOCOPE (N/A)  Patient Location: PACU  Anesthesia Type: General  Level of Consciousness: awake, alert  and oriented  Airway & Oxygen Therapy: Patient Spontanous Breathing and Patient connected to nasal cannula oxygen  Post-op Assessment: Report given to PACU RN and Post -op Vital signs reviewed and stable  Post vital signs: Reviewed and stable  Complications: No apparent anesthesia complications

## 2012-05-29 NOTE — Interval H&P Note (Signed)
History and Physical Interval Note:  05/29/2012 11:14 AM  Catherine Castillo  has presented today for surgery, with the diagnosis of Post Menopausal Bleeding  The various methods of treatment have been discussed with the patient and family. After consideration of risks, benefits and other options for treatment, the patient has consented to  Procedure(s) (LRB): DILATATION & CURETTAGE/HYSTEROSCOPY WITH RESECTOCOPE (N/A) as a surgical intervention .  The patient's history has been reviewed, patient examined, no change in status, stable for surgery.  I have reviewed the patient's chart and labs.  Questions were answered to the patient's satisfaction.     Purcell Nails

## 2012-05-30 NOTE — Anesthesia Postprocedure Evaluation (Signed)
  Anesthesia Post-op Note  Patient: Catherine Castillo  Procedure(s) Performed: Procedure(s) (LRB): DILATATION & CURETTAGE/HYSTEROSCOPY WITH RESECTOCOPE (N/A)  Patient Location: PACU  Anesthesia Type: General  Level of Consciousness: awake, alert  and oriented  Airway and Oxygen Therapy: Patient Spontanous Breathing  Post-op Pain: none  Post-op Assessment: Post-op Vital signs reviewed, Patient's Cardiovascular Status Stable, Respiratory Function Stable, Patent Airway, No signs of Nausea or vomiting and Pain level controlled  Post-op Vital Signs: Reviewed and stable  Complications: No apparent anesthesia complications

## 2012-06-12 ENCOUNTER — Ambulatory Visit (INDEPENDENT_AMBULATORY_CARE_PROVIDER_SITE_OTHER): Payer: BC Managed Care – PPO | Admitting: Obstetrics and Gynecology

## 2012-06-12 ENCOUNTER — Encounter: Payer: Self-pay | Admitting: Obstetrics and Gynecology

## 2012-06-12 VITALS — BP 130/74 | Ht 69.0 in | Wt 237.0 lb

## 2012-06-12 DIAGNOSIS — N924 Excessive bleeding in the premenopausal period: Secondary | ICD-10-CM

## 2012-06-12 NOTE — Progress Notes (Signed)
DATE OF SURGERY: 05/29/2012 TYPE OF SURGERY: Hysteroscopy/D&C PAIN:No VAG BLEEDING: no VAG DISCHARGE: yes yellowish brown NORMAL GI FUNCTN: yes NORMAL GU FUNCTN: yes  1. Endometrium, curettage, possible vaginal polyp - POLYPOID DISORDERED PROLIFERATIVE ENDOMETRIUM. - NEGATIVE FOR HYPERPLASIA OR MALIGNANCY. - POLYPOID BENIGN ENDOCERVICAL MUCOSA. - FRAGMENTS OF BENIGN SMOOTH MUSCLE. - DETACHED FRAGMENTS OF SQUAMOUS EPITHELIUM; NEGATIVE FOR INTRAEPITHELIAL LESION OR MALIGNANCY. 2. Uterine fibroid(s) - FRAGMENTS OF BENIGN SMOOTH MUSCLE (LEIOMYOMATA). - PROLIFERATIVE PHASE ENDOMETRIUM; NEGATIVE FOR HYPERPLASIA OR MALIGNANCY.  No complaints Filed Vitals:   06/12/12 1504  BP: 130/74   ROS: noncontributory  Pelvic exam:  VULVA: normal appearing vulva with no masses, tenderness or lesions,  VAGINA: normal appearing vagina with normal color and discharge, no lesions, CERVIX: normal appearing cervix without discharge or lesions,  UTERUS: uterus is normal size, shape, consistency and nontender,  ADNEXA: normal adnexa in size, nontender and no masses.  Path reviewed and benign  A/P On Tamoxifen another 79yrs approx RTO July for AEX

## 2012-07-23 ENCOUNTER — Other Ambulatory Visit: Payer: BC Managed Care – PPO | Admitting: Lab

## 2012-07-23 DIAGNOSIS — C50519 Malignant neoplasm of lower-outer quadrant of unspecified female breast: Secondary | ICD-10-CM

## 2012-07-23 DIAGNOSIS — C50919 Malignant neoplasm of unspecified site of unspecified female breast: Secondary | ICD-10-CM

## 2012-07-23 LAB — CBC WITH DIFFERENTIAL/PLATELET
BASO%: 0.8 % (ref 0.0–2.0)
EOS%: 3.5 % (ref 0.0–7.0)
HCT: 35.1 % (ref 34.8–46.6)
HGB: 11.2 g/dL — ABNORMAL LOW (ref 11.6–15.9)
MCHC: 32 g/dL (ref 31.5–36.0)
MONO#: 0.5 10*3/uL (ref 0.1–0.9)
NEUT%: 54.6 % (ref 38.4–76.8)
RDW: 15.3 % — ABNORMAL HIGH (ref 11.2–14.5)
WBC: 5.8 10*3/uL (ref 3.9–10.3)
lymph#: 1.9 10*3/uL (ref 0.9–3.3)

## 2012-07-23 LAB — COMPREHENSIVE METABOLIC PANEL (CC13)
ALT: 47 U/L (ref 0–55)
AST: 26 U/L (ref 5–34)
Albumin: 3.5 g/dL (ref 3.5–5.0)
CO2: 23 mEq/L (ref 22–29)
Calcium: 9.4 mg/dL (ref 8.4–10.4)
Chloride: 103 mEq/L (ref 98–107)
Creatinine: 0.7 mg/dL (ref 0.6–1.1)
Potassium: 4.2 mEq/L (ref 3.5–5.1)
Sodium: 137 mEq/L (ref 136–145)
Total Protein: 6.5 g/dL (ref 6.4–8.3)

## 2012-07-30 ENCOUNTER — Ambulatory Visit: Payer: BC Managed Care – PPO | Admitting: Physician Assistant

## 2012-07-30 NOTE — Progress Notes (Signed)
[facility] today.  Letter mailed to patient.  

## 2012-09-21 ENCOUNTER — Ambulatory Visit: Payer: BC Managed Care – PPO | Admitting: Internal Medicine

## 2012-09-28 ENCOUNTER — Encounter: Payer: Self-pay | Admitting: Family Medicine

## 2012-09-28 ENCOUNTER — Telehealth: Payer: Self-pay | Admitting: *Deleted

## 2012-09-28 ENCOUNTER — Ambulatory Visit (INDEPENDENT_AMBULATORY_CARE_PROVIDER_SITE_OTHER): Payer: BC Managed Care – PPO | Admitting: Family Medicine

## 2012-09-28 VITALS — BP 158/90 | HR 99 | Temp 98.0°F | Ht 69.0 in | Wt 243.8 lb

## 2012-09-28 DIAGNOSIS — E119 Type 2 diabetes mellitus without complications: Secondary | ICD-10-CM

## 2012-09-28 DIAGNOSIS — E785 Hyperlipidemia, unspecified: Secondary | ICD-10-CM

## 2012-09-28 DIAGNOSIS — E039 Hypothyroidism, unspecified: Secondary | ICD-10-CM

## 2012-09-28 DIAGNOSIS — I10 Essential (primary) hypertension: Secondary | ICD-10-CM

## 2012-09-28 LAB — CBC WITH DIFFERENTIAL/PLATELET
Basophils Absolute: 0 10*3/uL (ref 0.0–0.1)
Eosinophils Relative: 2.4 % (ref 0.0–5.0)
Lymphocytes Relative: 28.8 % (ref 12.0–46.0)
Monocytes Relative: 6.5 % (ref 3.0–12.0)
Neutrophils Relative %: 61.7 % (ref 43.0–77.0)
Platelets: 344 10*3/uL (ref 150.0–400.0)
RDW: 16.7 % — ABNORMAL HIGH (ref 11.5–14.6)
WBC: 6.7 10*3/uL (ref 4.5–10.5)

## 2012-09-28 LAB — HEPATIC FUNCTION PANEL
ALT: 43 U/L — ABNORMAL HIGH (ref 0–35)
Alkaline Phosphatase: 74 U/L (ref 39–117)
Bilirubin, Direct: 0 mg/dL (ref 0.0–0.3)
Total Bilirubin: 0.6 mg/dL (ref 0.3–1.2)
Total Protein: 7.5 g/dL (ref 6.0–8.3)

## 2012-09-28 LAB — LIPID PANEL
Cholesterol: 197 mg/dL (ref 0–200)
LDL Cholesterol: 126 mg/dL — ABNORMAL HIGH (ref 0–99)
Total CHOL/HDL Ratio: 5
Triglycerides: 157 mg/dL — ABNORMAL HIGH (ref 0.0–149.0)
VLDL: 31.4 mg/dL (ref 0.0–40.0)

## 2012-09-28 LAB — BASIC METABOLIC PANEL
CO2: 27 mEq/L (ref 19–32)
Chloride: 101 mEq/L (ref 96–112)
Creatinine, Ser: 0.6 mg/dL (ref 0.4–1.2)
Sodium: 137 mEq/L (ref 135–145)

## 2012-09-28 LAB — HEMOGLOBIN A1C: Hgb A1c MFr Bld: 9 % — ABNORMAL HIGH (ref 4.6–6.5)

## 2012-09-28 LAB — T4, FREE: Free T4: 0.96 ng/dL (ref 0.60–1.60)

## 2012-09-28 MED ORDER — GLUCOSE BLOOD VI STRP: ORAL_STRIP | Status: DC

## 2012-09-28 MED ORDER — LEVOTHYROXINE SODIUM 175 MCG PO TABS: 175.0000 ug | ORAL_TABLET | Freq: Every day | ORAL | Status: DC

## 2012-09-28 MED ORDER — LANCETS 30G MISC: 1.0000 [IU] | Freq: Two times a day (BID) | Status: DC

## 2012-09-28 MED ORDER — LISINOPRIL 20 MG PO TABS: 20.0000 mg | ORAL_TABLET | Freq: Every day | ORAL | Status: DC

## 2012-09-28 MED ORDER — METFORMIN HCL 500 MG PO TABS: ORAL_TABLET | ORAL | Status: DC

## 2012-09-28 NOTE — Progress Notes (Signed)
  Subjective:    Patient ID: Catherine Castillo, female    DOB: [birth year], 51 y.o.   MRN: [id number]  HPI DM- chronic problem, has been diet controlled.  UTD on eye exam.  CBGs have been running high lately- 170 this AM.  Previously was 110s.  Yesterday was 220 fasting.  Admits to poor eating.  Hypothyroid- chronic problem, on Synthroid .  Having palpitations, weight gain, fatigue.  No diarrhea or constipation.  HTN- chronic problem, on lisinopril 10 mg daily.  No CP.  Intermittent SOB w/ climbing stairs.  No HAs, visual changes, edema.   Review of Systems For ROS see HPI     Objective:   Physical Exam  Constitutional: She is oriented to person, place, and time. She appears well-developed and well-nourished. No distress.  HENT:  Head: Normocephalic and atraumatic.  Eyes: Conjunctivae normal and EOM are normal. Pupils are equal, round, and reactive to light.  Neck: Normal range of motion. Neck supple. Thyromegaly present.  Cardiovascular: Normal rate, regular rhythm, normal heart sounds and intact distal pulses.   No murmur heard. Pulmonary/Chest: Effort normal and breath sounds normal. No respiratory distress.  Abdominal: Soft. She exhibits no distension. There is no tenderness.  Musculoskeletal: She exhibits no edema.  Lymphadenopathy:    She has no cervical adenopathy.  Neurological: She is alert and oriented to person, place, and time.  Skin: Skin is warm and dry.  Psychiatric: She has a normal mood and affect. Her behavior is normal.          Assessment & Plan:

## 2012-09-28 NOTE — Telephone Encounter (Signed)
Message copied by Verdie Shire on Fri Sep 28, 2012  6:09 PM ------      Message from: Sheliah Hatch      Created: Fri Sep 28, 2012  4:43 PM       A1C is now 35- this requires treatment.  Start Metformin 500mg  twice daily for 2 weeks and then increase to 1000mg  (2 tabs) twice daily.      Continue Lipitor nightly- LDL is higher than we would like in someone w/ diabetes      TSH is low- decrease synthroid to 175mg  daily      Please work on healthy diet and regular exercise

## 2012-09-28 NOTE — Patient Instructions (Addendum)
Follow up in 1 month to recheck symptoms Schedule your complete physical at your convenience We'll notify you of your lab results and make changes as needed Call with any questions or concerns Happy [birth year] Birthday! Happy Holidays!!!

## 2012-09-28 NOTE — Telephone Encounter (Signed)
Spoke with the pt and informed her of the recent lab results and note.  Pt understood and agreed.  New rx's were sent to her pharmacy.//AB/CMA

## 2012-09-30 NOTE — Assessment & Plan Note (Signed)
Chronic problem, poorly controlled.  Pt was supposed to be taking 2 tabs daily but has only been taking one.  Increase dose and follow in 1 month to ensure better BP control.  Pt expressed understanding and is in agreement w/ plan.

## 2012-09-30 NOTE — Assessment & Plan Note (Signed)
Chronic problem.  Pt having mix of hypo and hyperthyroid sxs.  Check labs and determine if med dose is appropriate.  Pt expressed understanding and is in agreement w/ plan.

## 2012-09-30 NOTE — Assessment & Plan Note (Signed)
Chronic problem.  Apparently uncontrolled.  Has not followed up as directed.  Check labs to determine the starting point for meds as it appears pt is no longer able to manage on diet and exercise.  Encouraged healthier lifestyle and more attention to diabetes.  Pt in agreement.

## 2012-09-30 NOTE — Assessment & Plan Note (Signed)
Chronic problem.  Tolerating statin w/out difficulty.  Check labs.  Adjust meds prn  

## 2012-10-01 ENCOUNTER — Telehealth: Payer: Self-pay | Admitting: *Deleted

## 2012-10-01 MED ORDER — GLUCOSE BLOOD VI STRP: ORAL_STRIP | Status: DC

## 2012-10-01 NOTE — Telephone Encounter (Signed)
Patient called triage line, states pharmacy advised that the current test strips she is using is being recalled and needs new testing meter and supplies. Please call pt.

## 2012-10-01 NOTE — Telephone Encounter (Signed)
Discuss with patient,glucometer placed up front, Rx sent to pharmacy

## 2012-10-29 ENCOUNTER — Ambulatory Visit: Payer: BC Managed Care – PPO | Admitting: Family Medicine

## 2012-11-26 ENCOUNTER — Encounter: Payer: BC Managed Care – PPO | Admitting: Family Medicine

## 2012-11-28 ENCOUNTER — Ambulatory Visit: Payer: BC Managed Care – PPO | Admitting: Family Medicine

## 2012-12-01 ENCOUNTER — Other Ambulatory Visit: Payer: Self-pay

## 2012-12-10 ENCOUNTER — Other Ambulatory Visit: Payer: Self-pay | Admitting: *Deleted

## 2012-12-10 MED ORDER — ATORVASTATIN CALCIUM 40 MG PO TABS: 40.0000 mg | ORAL_TABLET | Freq: Every day | ORAL | Status: DC

## 2012-12-10 NOTE — Telephone Encounter (Signed)
Rx sent 

## 2012-12-17 ENCOUNTER — Ambulatory Visit: Payer: BC Managed Care – PPO | Admitting: Family Medicine

## 2012-12-20 ENCOUNTER — Encounter: Payer: Self-pay | Admitting: Family Medicine

## 2012-12-20 ENCOUNTER — Ambulatory Visit (INDEPENDENT_AMBULATORY_CARE_PROVIDER_SITE_OTHER): Payer: BC Managed Care – PPO | Admitting: Family Medicine

## 2012-12-20 VITALS — BP 130/60 | HR 111 | Temp 99.2°F | Ht 69.25 in | Wt 242.0 lb

## 2012-12-20 DIAGNOSIS — J0100 Acute maxillary sinusitis, unspecified: Secondary | ICD-10-CM

## 2012-12-20 DIAGNOSIS — E119 Type 2 diabetes mellitus without complications: Secondary | ICD-10-CM

## 2012-12-20 DIAGNOSIS — E039 Hypothyroidism, unspecified: Secondary | ICD-10-CM

## 2012-12-20 LAB — BASIC METABOLIC PANEL
CO2: 24 mEq/L (ref 19–32)
Calcium: 8.8 mg/dL (ref 8.4–10.5)
Creatinine, Ser: 0.6 mg/dL (ref 0.4–1.2)
GFR: 108.95 mL/min (ref >60.00)
Glucose, Bld: 119 mg/dL — ABNORMAL HIGH (ref 70–99)
Sodium: 136 mEq/L (ref 135–145)

## 2012-12-20 MED ORDER — SULFAMETHOXAZOLE-TRIMETHOPRIM 800-160 MG PO TABS: 1.0000 | ORAL_TABLET | Freq: Two times a day (BID) | ORAL | Status: DC

## 2012-12-20 NOTE — Patient Instructions (Addendum)
Follow up in 3 months to recheck sugars We'll notify you of your lab results and make any changes if needed Start the Bactrim- twice daily- for the sinus infection Mucinex DM for cough REST! Drink plenty of fluids Call with any questions or concerns Hang in there!

## 2012-12-20 NOTE — Assessment & Plan Note (Signed)
Chronic problem.  Was started on Metformin at last visit.  Tolerating meds w/out difficulty.  Asymptomatic.  UTD on eye exam.  Check labs.  Adjust meds prn

## 2012-12-20 NOTE — Assessment & Plan Note (Signed)
New.  Pt's sxs and PE consistent w/ infxn.  Start abx.  Reviewed supportive care and red flags that should prompt return.  Pt expressed understanding and is in agreement w/ plan.  

## 2012-12-20 NOTE — Assessment & Plan Note (Signed)
Chronic problem, dose was adjust at last visit.  Repeat labs today to determine if dose appropriate.

## 2012-12-20 NOTE — Progress Notes (Signed)
  Subjective:    Patient ID: Catherine Castillo, female    DOB: 12/16/1960, 52 y.o.   MRN: [id number]  HPI URI- sxs started as a 'cold' last week.  Tuesday developed dry cough, sinus pain, nightly wheezing, intermittent low grade fevers.  No ear pain.  + sick contacts.  No N/V/D.  DM- chronic problem, now on metformin.  Due for labs.  No exercising regularly.  CBGs 140s fasting.  UTD on eye exam.  No numbness/tingling hands/feet.  Hypothyroid- chronic problem, dose was adjusted at last visit.  Currently asymptomatic- no excessive fatigue, palpitations, heat/cold intolerance.   Review of Systems For ROS see HPI     Objective:   Physical Exam  Vitals reviewed. Constitutional: She is oriented to person, place, and time. She appears well-developed and well-nourished. No distress.  HENT:  Head: Normocephalic and atraumatic.  Right Ear: Tympanic membrane normal.  Left Ear: Tympanic membrane normal.  Nose: Mucosal edema and rhinorrhea present. Right sinus exhibits maxillary sinus tenderness and frontal sinus tenderness. Left sinus exhibits maxillary sinus tenderness and frontal sinus tenderness.  Mouth/Throat: Uvula is midline and mucous membranes are normal. Posterior oropharyngeal erythema present. No oropharyngeal exudate.  Eyes: Conjunctivae and EOM are normal. Pupils are equal, round, and reactive to light.  Neck: Normal range of motion. Neck supple. No thyromegaly present.  Cardiovascular: Normal rate, regular rhythm, normal heart sounds and intact distal pulses.   Pulmonary/Chest: Effort normal and breath sounds normal. No respiratory distress. She has no wheezes.  Musculoskeletal: She exhibits no edema.  Lymphadenopathy:    She has no cervical adenopathy.  Neurological: She is alert and oriented to person, place, and time.  Skin: Skin is warm and dry.  Psychiatric: She has a normal mood and affect. Her behavior is normal. Thought content normal.          Assessment & Plan:

## 2012-12-21 ENCOUNTER — Ambulatory Visit: Payer: BC Managed Care – PPO | Admitting: Family Medicine

## 2012-12-24 ENCOUNTER — Other Ambulatory Visit: Payer: Self-pay | Admitting: Family Medicine

## 2012-12-25 ENCOUNTER — Telehealth: Payer: Self-pay | Admitting: Family Medicine

## 2012-12-25 MED ORDER — LISINOPRIL 20 MG PO TABS: 20.0000 mg | ORAL_TABLET | Freq: Every day | ORAL | Status: DC

## 2012-12-25 NOTE — Telephone Encounter (Signed)
Refill: Lisinopril 20 mg tablet. Request for 90 day supply

## 2012-12-25 NOTE — Telephone Encounter (Signed)
Rx sent to the pharmacy by e-script.//AB/CMA 

## 2013-01-01 ENCOUNTER — Telehealth: Payer: Self-pay | Admitting: *Deleted

## 2013-01-01 MED ORDER — LEVOTHYROXINE SODIUM 150 MCG PO TABS: 150.0000 ug | ORAL_TABLET | Freq: Every day | ORAL | Status: DC

## 2013-01-01 NOTE — Telephone Encounter (Signed)
Pt called and left message asking to leave her recent lab results on her VM.   LMOM informing the pt that her recent TSH is again too low.  Need to decrease thyroid med to daily.  Also informed the pt that a new rx will be sent to her pharmacy.  Rx sent to the pharmacy by e-script.//AB/CMA

## 2013-02-25 ENCOUNTER — Ambulatory Visit (INDEPENDENT_AMBULATORY_CARE_PROVIDER_SITE_OTHER): Payer: BC Managed Care – PPO | Admitting: Family Medicine

## 2013-02-25 ENCOUNTER — Encounter: Payer: Self-pay | Admitting: Family Medicine

## 2013-02-25 VITALS — BP 120/80 | HR 99 | Temp 98.3°F | Ht 69.25 in | Wt 232.8 lb

## 2013-02-25 DIAGNOSIS — E039 Hypothyroidism, unspecified: Secondary | ICD-10-CM

## 2013-02-25 DIAGNOSIS — E785 Hyperlipidemia, unspecified: Secondary | ICD-10-CM

## 2013-02-25 LAB — HEPATIC FUNCTION PANEL
ALT: 58 U/L — ABNORMAL HIGH (ref 0–35)
AST: 43 U/L — ABNORMAL HIGH (ref 0–37)
Albumin: 4.1 g/dL (ref 3.5–5.2)
Alkaline Phosphatase: 58 U/L (ref 39–117)
Total Protein: 7.7 g/dL (ref 6.0–8.3)

## 2013-02-25 LAB — LIPID PANEL: Cholesterol: 170 mg/dL (ref 0–200)

## 2013-02-25 MED ORDER — ATORVASTATIN CALCIUM 40 MG PO TABS: 40.0000 mg | ORAL_TABLET | Freq: Every day | ORAL | Status: DC

## 2013-02-25 MED ORDER — METFORMIN HCL 1000 MG PO TABS: 1000.0000 mg | ORAL_TABLET | Freq: Two times a day (BID) | ORAL | Status: DC

## 2013-02-25 MED ORDER — SYNTHROID 137 MCG PO TABS: 137.0000 ug | ORAL_TABLET | Freq: Every day | ORAL | Status: DC

## 2013-02-25 MED ORDER — LISINOPRIL 20 MG PO TABS: 20.0000 mg | ORAL_TABLET | Freq: Every day | ORAL | Status: DC

## 2013-02-25 NOTE — Progress Notes (Signed)
  Subjective:    Patient ID: Catherine Castillo, female    DOB: Feb 09, 1961, 52 y.o.   MRN: [id number]  HPI Palpitations- hx of hypothyroid, recently TSH has been low and we've been decreasing meds.  HR is high- 'always, and i feel it'.  + hot flashes (but on Tamoxifen) and will have associated SOB.  Has lost 10 lbs- is working on it.  Pt feels levels were far more stable on brand name Synthroid than the generic.  Attempted to switch years ago but 'did not do well'.  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   Review of Systems For ROS see HPI     Objective:   Physical Exam  Vitals reviewed. Constitutional: She is oriented to person, place, and time. She appears well-developed and well-nourished. No distress.  HENT:  Head: Normocephalic and atraumatic.  Eyes: Conjunctivae and EOM are normal. Pupils are equal, round, and reactive to light.  Neck: Normal range of motion. Neck supple. No thyromegaly present.  Cardiovascular: Normal rate, regular rhythm, normal heart sounds and intact distal pulses.   No murmur heard. Pulmonary/Chest: Effort normal and breath sounds normal. No respiratory distress.  Abdominal: Soft. She exhibits no distension. There is no tenderness.  Musculoskeletal: She exhibits no edema.  Lymphadenopathy:    She has no cervical adenopathy.  Neurological: She is alert and oriented to person, place, and time.  Skin: Skin is warm and dry.  Psychiatric: She has a normal mood and affect. Her behavior is normal.          Assessment & Plan:

## 2013-02-25 NOTE — Assessment & Plan Note (Signed)
Chronic problem.  Tolerating statin w/out difficulty.  Check labs.  Adjust meds prn  

## 2013-02-25 NOTE — Patient Instructions (Addendum)
Follow up in 3 months to recheck diabetes and thyroid Start the Synthroid daily Continue the Metformin twice daily but 1 tab instead of 2 each time Call with any questions or concerns GOOD LUCK w/ YOUR EXAM!

## 2013-02-25 NOTE — Assessment & Plan Note (Signed)
Chronic problem.  Lately have been having difficulty keeping TSH WNL.  Will switch back to brand name synthroid and decrease dose in hopes of controlling palpitations and hot flashes.  Will follow closely.

## 2013-02-28 ENCOUNTER — Telehealth: Payer: Self-pay | Admitting: Family Medicine

## 2013-02-28 MED ORDER — LEVOTHYROXINE SODIUM 137 MCG PO TABS: 137.0000 ug | ORAL_TABLET | Freq: Every day | ORAL | Status: DC

## 2013-02-28 NOTE — Telephone Encounter (Signed)
Left message to call office

## 2013-02-28 NOTE — Telephone Encounter (Signed)
If we allow generic substitution, they'll provide generic medication

## 2013-02-28 NOTE — Telephone Encounter (Signed)
Discuss with patient Rx resent at pt request. Pt made aware if we remove DAW that med can be substituted, Per Pt pharmacy has already advise her that this will not happen because synthroid is a prefer med so they will give her branded at generic price.. Pt advise to make sure check with pharmacy before they send med out to her. Pt ok will f/u with pharmacy to make sure that she was advise right by them.

## 2013-02-28 NOTE — Telephone Encounter (Signed)
.  please advise ok to change Rx

## 2013-02-28 NOTE — Telephone Encounter (Signed)
Patient called stating she needs [place] to re-write her synthroid as brand name with generic substitution allowed in order for her insurance to give her the brand name at generic price. Currently the rx is written for brand name only. Pt uses [facility].

## 2013-03-08 ENCOUNTER — Telehealth: Payer: Self-pay | Admitting: *Deleted

## 2013-03-08 NOTE — Telephone Encounter (Signed)
Pt called requesting an appt to see GCM at the end of Sept.2014. gv appt d/t for 07/15/13 @10 :30am. Pt also wants to have labs a week before, i emailed GCM to enter lab orders so that i could schedule her appt. Pt is aware...td

## 2013-03-14 ENCOUNTER — Other Ambulatory Visit (HOSPITAL_COMMUNITY): Payer: Self-pay | Admitting: Oncology

## 2013-03-14 DIAGNOSIS — C50919 Malignant neoplasm of unspecified site of unspecified female breast: Secondary | ICD-10-CM

## 2013-03-15 ENCOUNTER — Other Ambulatory Visit: Payer: Self-pay | Admitting: Oncology

## 2013-05-16 ENCOUNTER — Other Ambulatory Visit: Payer: Self-pay | Admitting: Family Medicine

## 2013-05-20 MED ORDER — LISINOPRIL 20 MG PO TABS: 20.0000 mg | ORAL_TABLET | Freq: Every day | ORAL | Status: DC

## 2013-07-02 ENCOUNTER — Ambulatory Visit: Payer: BC Managed Care – PPO | Admitting: Family Medicine

## 2013-07-09 ENCOUNTER — Encounter: Payer: Self-pay | Admitting: *Deleted

## 2013-07-15 ENCOUNTER — Telehealth: Payer: Self-pay | Admitting: Oncology

## 2013-07-15 ENCOUNTER — Ambulatory Visit (HOSPITAL_BASED_OUTPATIENT_CLINIC_OR_DEPARTMENT_OTHER): Payer: BC Managed Care – PPO | Admitting: Oncology

## 2013-07-15 ENCOUNTER — Ambulatory Visit (HOSPITAL_BASED_OUTPATIENT_CLINIC_OR_DEPARTMENT_OTHER): Payer: BC Managed Care – PPO | Admitting: Lab

## 2013-07-15 VITALS — BP 144/89 | HR 96 | Temp 98.3°F | Resp 20 | Ht 69.25 in | Wt 233.0 lb

## 2013-07-15 DIAGNOSIS — C50519 Malignant neoplasm of lower-outer quadrant of unspecified female breast: Secondary | ICD-10-CM

## 2013-07-15 DIAGNOSIS — N393 Stress incontinence (female) (male): Secondary | ICD-10-CM

## 2013-07-15 DIAGNOSIS — C50919 Malignant neoplasm of unspecified site of unspecified female breast: Secondary | ICD-10-CM

## 2013-07-15 DIAGNOSIS — C50912 Malignant neoplasm of unspecified site of left female breast: Secondary | ICD-10-CM

## 2013-07-15 DIAGNOSIS — Z853 Personal history of malignant neoplasm of breast: Secondary | ICD-10-CM | POA: Insufficient documentation

## 2013-07-15 DIAGNOSIS — C50512 Malignant neoplasm of lower-outer quadrant of left female breast: Secondary | ICD-10-CM

## 2013-07-15 LAB — COMPREHENSIVE METABOLIC PANEL (CC13)
ALT: 47 U/L (ref 0–55)
AST: 39 U/L — ABNORMAL HIGH (ref 5–34)
Albumin: 3.8 g/dL (ref 3.5–5.0)
Alkaline Phosphatase: 75 U/L (ref 40–150)
BUN: 17.1 mg/dL (ref 7.0–26.0)
Chloride: 103 mEq/L (ref 98–109)
Glucose: 144 mg/dl — ABNORMAL HIGH (ref 70–140)
Potassium: 4.4 mEq/L (ref 3.5–5.1)
Sodium: 139 mEq/L (ref 136–145)
Total Bilirubin: 0.65 mg/dL (ref 0.20–1.20)
Total Protein: 7.8 g/dL (ref 6.4–8.3)

## 2013-07-15 LAB — CBC WITH DIFFERENTIAL/PLATELET
BASO%: 0.5 % (ref 0.0–2.0)
Basophils Absolute: 0 10*3/uL (ref 0.0–0.1)
EOS%: 1.8 % (ref 0.0–7.0)
LYMPH%: 26.3 % (ref 14.0–49.7)
MCH: 29.1 pg (ref 25.1–34.0)
MCHC: 33.3 g/dL (ref 31.5–36.0)
MCV: 87.6 fL (ref 79.5–101.0)
MONO%: 6.9 % (ref 0.0–14.0)
NEUT#: 5.4 10*3/uL (ref 1.5–6.5)
Platelets: 331 10*3/uL (ref 145–400)
RBC: 4.72 10*6/uL (ref 3.70–5.45)
RDW: 13.5 % (ref 11.2–14.5)
lymph#: 2.2 10*3/uL (ref 0.9–3.3)

## 2013-07-15 MED ORDER — TAMOXIFEN CITRATE 20 MG PO TABS: 20.0000 mg | ORAL_TABLET | Freq: Every day | ORAL | Status: DC

## 2013-07-15 NOTE — Progress Notes (Signed)
ID: Catherine Castillo   PCP: Neena Rhymes, MD GYN: Osborn Coho SU:  OTHER MD:  Interval History:  Catherine Castillo returns today for followup of her breast cancer. 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  ROS: She has mild hot flashes associated with the tamoxifen. She has had some stress urinary incontinence, which I don't think is going to be related to that medication. She tells me her blood sugar is much better controlled now that she started exercising. She swims at the [place] about 3 times a week, up to an hour time. Otherwise a detailed review of systems is entirely noncontributory  Medications: I have reviewed the patient's current medications.  Current Outpatient Prescriptions  Medication Sig Dispense Refill  . atorvastatin (LIPITOR) 40 MG tablet Take 1 tablet (40 mg total) by mouth at bedtime.  90 tablet  1  . glucose blood (ONE TOUCH ULTRA TEST) test strip Use as instructed  100 each  12  . Lancets 30G MISC 1 Units by Does not apply route 2 (two) times daily.  100 each  6  . levothyroxine (SYNTHROID) 137 MCG tablet Take 1 tablet (137 mcg total) by mouth daily before breakfast.  90 tablet  3  . lisinopril (PRINIVIL,ZESTRIL) 20 MG tablet Take 1 tablet (20 mg total) by mouth daily.  90 tablet  1  . metFORMIN (GLUCOPHAGE) 1000 MG tablet Take 1 tablet (1,000 mg total) by mouth 2 (two) times daily with a meal.  60 tablet  3  . Multiple Vitamin (MULTIVITAMIN WITH MINERALS) TABS Take 1 tablet by mouth daily.      . Probiotic Product (ALIGN) 4 MG CAPS Take by mouth daily.        Marland Kitchen sulfamethoxazole-trimethoprim (BACTRIM DS,SEPTRA DS) 800-160 MG per tablet Take 1 tablet by mouth 2 (two) times daily.  20 tablet  0  . tamoxifen (NOLVADEX) 20 MG tablet TAKE 1 TABLET DAILY  90 tablet  3   No current facility-administered medications for this visit.   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, currently looking for a job; lives by herself, her mother stays w her 1/2 of every year  Objective: Middle-aged white woman who appears well Filed Vitals:   07/15/13 1025  BP: 144/89  Pulse: 96  Temp: 98.3 F (36.8 C)  Resp: 20    Physical Exam:    Sclerae unicteric  Oropharynx clear  No peripheral adenopathy  Lungs clear -- no rales or rhonchi  Heart regular rate and rhythm  Abdomen soft, nontender, old midline scar well healed, no masses palpated  MSK no focal spinal tenderness, no peripheral edema  Neuro nonfocal, well oriented, appropriate affect  Breast exam: Right breast no suspicious findings; left breast status post lumpectomy and radiation with no evidence of local recurrence; the left axilla is benign  Lab Results:   CMP    Chemistry      Component Value Date/Time   NA 136 12/20/2012 1035   NA 137 07/23/2012 0803   K 3.6 12/20/2012 1035   K 4.2 07/23/2012 0803   CL 100 12/20/2012 1035   CL 103 07/23/2012 0803   CO2 24 12/20/2012 1035   CO2 23 07/23/2012 0803   BUN 9 12/20/2012 1035   BUN 15.0 07/23/2012 0803   CREATININE 0.6 12/20/2012 1035   CREATININE 0.7 07/23/2012  4098      Component Value Date/Time   CALCIUM 8.8 12/20/2012 1035   CALCIUM 9.4 07/23/2012 0803   ALKPHOS 58 02/25/2013 0959   ALKPHOS 72 07/23/2012 0803   AST 43* 02/25/2013 0959   AST 26 07/23/2012 0803   ALT 58* 02/25/2013 0959   ALT 47 07/23/2012 0803   BILITOT 0.6 02/25/2013 0959   BILITOT 0.50 07/23/2012 0803       CBC Lab Results  Component Value Date   WBC 6.7 09/28/2012   HGB 12.8 09/28/2012   HCT 38.3 09/28/2012   MCV 78.6 09/28/2012   PLT 344.0 09/28/2012   NEUTROABS 4.1 09/28/2012    Studies/Results:  She had mammography at [facility] July 2014, and it was unremarkable.  Assessment: 52 y.o.   woman status post left lumpectomy and sentinel lymph node sampling October of 2010 for a T1b N0 (Stage I) invasive ductal carcinoma, grade 1,  estrogen and progesterone receptor positive, HER-2 not amplified, with an MIB-1-1 of 14%;   (1) status post radiation completed December of 2010  (2) started tamoxifen December 2010   Plan:  We again went over the fact that her overall prognosis is sufficiently good that I think 5 years of tamoxifen is plenty. I think an additional 5 years might get her perhaps a 1% risk reduction. Accordingly the plan is going to be for her to see me again one year from now, and she will "graduate" at that visit. We will do lab work today and lab work then. She knows to call for any problems that may develop before that visit  MAGRINAT,GUSTAV C 07/15/2013

## 2013-07-15 NOTE — Addendum Note (Signed)
Addended by: Billey Co on: 07/15/2013 04:54 PM   Modules accepted: Orders

## 2013-07-23 ENCOUNTER — Ambulatory Visit (INDEPENDENT_AMBULATORY_CARE_PROVIDER_SITE_OTHER): Payer: BC Managed Care – PPO | Admitting: Family Medicine

## 2013-07-23 ENCOUNTER — Encounter: Payer: Self-pay | Admitting: Family Medicine

## 2013-07-23 VITALS — BP 138/88 | HR 78 | Temp 98.1°F | Resp 16 | Wt 236.2 lb

## 2013-07-23 DIAGNOSIS — E039 Hypothyroidism, unspecified: Secondary | ICD-10-CM

## 2013-07-23 DIAGNOSIS — N390 Urinary tract infection, site not specified: Secondary | ICD-10-CM

## 2013-07-23 DIAGNOSIS — I10 Essential (primary) hypertension: Secondary | ICD-10-CM

## 2013-07-23 DIAGNOSIS — E119 Type 2 diabetes mellitus without complications: Secondary | ICD-10-CM

## 2013-07-23 DIAGNOSIS — E785 Hyperlipidemia, unspecified: Secondary | ICD-10-CM

## 2013-07-23 LAB — POCT URINALYSIS DIPSTICK
Bilirubin, UA: NEGATIVE
Ketones, UA: NEGATIVE
Nitrite, UA: NEGATIVE
Protein, UA: NEGATIVE
Urobilinogen, UA: 0.2
pH, UA: 6

## 2013-07-23 LAB — LIPID PANEL
Cholesterol: 174 mg/dL (ref 0–200)
LDL Cholesterol: 101 mg/dL — ABNORMAL HIGH (ref 0–99)
Total CHOL/HDL Ratio: 5
VLDL: 37.4 mg/dL (ref 0.0–40.0)

## 2013-07-23 LAB — MICROALBUMIN / CREATININE URINE RATIO: Creatinine,U: 176.8 mg/dL

## 2013-07-23 LAB — HEMOGLOBIN A1C: Hgb A1c MFr Bld: 8.3 % — ABNORMAL HIGH (ref 4.6–6.5)

## 2013-07-23 MED ORDER — ONETOUCH DELICA LANCETS FINE MISC: 1.0000 | Freq: Two times a day (BID) | Status: DC

## 2013-07-23 NOTE — Progress Notes (Signed)
  Subjective:    Patient ID: Catherine Castillo, female    DOB: 1961-02-13, 52 y.o.   MRN: [id number]  HPI HTN- chronic problem, on Lisinopril 20mg .  No CP, SOB, HAs, visual changes, edema.  Hyperlipidemia- chronic problem, on Lipitor 40.  No abd pain, N/V, myalgias.  DM- chronic problem, was having GI side effects when Metformin was increased to 1000mg  BID.  Pt backed down to 500mg  BID and denies N/V/D.  Has not been following low carb diet this summer but has been working hard x1 month.  CBG 116 this AM.  UTD on eye exam- last done 6 months ago.  No numbness/tingling hands/feet.  Denies symptomatic lows.  Hypothyroid- chronic problem, on Synthroid daily.  Pt reports energy level is good.   Review of Systems For ROS see HPI     Objective:   Physical Exam  Vitals reviewed. Constitutional: She is oriented to person, place, and time. She appears well-developed and well-nourished. No distress.  HENT:  Head: Normocephalic and atraumatic.  Eyes: Conjunctivae and EOM are normal. Pupils are equal, round, and reactive to light.  Neck: Normal range of motion. Neck supple. No thyromegaly present.  Cardiovascular: Normal rate, regular rhythm, normal heart sounds and intact distal pulses.   No murmur heard. Pulmonary/Chest: Effort normal and breath sounds normal. No respiratory distress.  Abdominal: Soft. She exhibits no distension. There is no tenderness.  Musculoskeletal: She exhibits no edema.  Lymphadenopathy:    She has no cervical adenopathy.  Neurological: She is alert and oriented to person, place, and time.  Skin: Skin is warm and dry.  Psychiatric: She has a normal mood and affect. Her behavior is normal.          Assessment & Plan:

## 2013-07-23 NOTE — Assessment & Plan Note (Signed)
Chronic problem.  Was unable to tolerate metformin 1000mg  BID.  Doing well on 500mg  BID.  Admits that she did not follow low carb diet this summer after father's death.  Reports she is now 'back on track'.  UTD on eye exam.  Foot exam done today.  Check labs.  Adjust meds prn

## 2013-07-23 NOTE — Assessment & Plan Note (Signed)
Chronic problem.  Asymptomatic.  Check labs.  Adjust meds prn  

## 2013-07-23 NOTE — Patient Instructions (Addendum)
Follow up in 3 months to recheck sugars We'll notify you of your lab results and make any changes if needed Keep up the good work!  You look great! Call with any questions or concerns Happy Fall!!!

## 2013-07-23 NOTE — Assessment & Plan Note (Signed)
Chronic problem.  Adequate control.  Asymptomatic.  Check labs.  No anticipated med changes 

## 2013-07-23 NOTE — Assessment & Plan Note (Signed)
Chronic problem.  Tolerating statin w/out difficulty.  Check labs.  Adjust meds prn  

## 2013-07-25 ENCOUNTER — Other Ambulatory Visit: Payer: Self-pay | Admitting: General Practice

## 2013-07-25 LAB — URINE CULTURE: Colony Count: 70000

## 2013-07-25 MED ORDER — LEVOTHYROXINE SODIUM 150 MCG PO TABS: 150.0000 ug | ORAL_TABLET | Freq: Every day | ORAL | Status: DC

## 2013-07-25 MED ORDER — SITAGLIPTIN PHOS-METFORMIN HCL 50-500 MG PO TABS: 1.0000 | ORAL_TABLET | Freq: Two times a day (BID) | ORAL | Status: DC

## 2013-08-13 ENCOUNTER — Other Ambulatory Visit: Payer: Self-pay | Admitting: Family Medicine

## 2013-08-13 NOTE — Telephone Encounter (Signed)
Med filled.  

## 2013-08-22 ENCOUNTER — Other Ambulatory Visit: Payer: Self-pay | Admitting: Family Medicine

## 2013-10-03 ENCOUNTER — Telehealth: Payer: Self-pay | Admitting: Family Medicine

## 2013-10-03 NOTE — Telephone Encounter (Signed)
If pt is allergic to PCN, she should not take Amox (same chemical structure) Also, it is not recommended that pt's receive abxs prior to dental cleanings (even with artificial joints)

## 2013-10-03 NOTE — Telephone Encounter (Signed)
Called pt back to find out whether she is having a deep root cleaning or a regular cleaning.

## 2013-10-03 NOTE — Telephone Encounter (Signed)
Patient's Mom is calling on behalf of the patient to request an rx for the patient who needs to be pre-medicated before her dentist visit next week.  She would like the rx sent to [facility] on [address] in [place]. Pt's Mom states that she is allergic to Penicillin and was given Amoxicillin last time.

## 2013-10-04 NOTE — Telephone Encounter (Signed)
Called pt again in regards to this procedure. Message left to return call.

## 2013-10-08 NOTE — Telephone Encounter (Signed)
Called pt again. Closing encounter until pt returns call.  

## 2013-10-23 ENCOUNTER — Ambulatory Visit: Payer: BC Managed Care – PPO | Admitting: Family Medicine

## 2013-10-24 ENCOUNTER — Ambulatory Visit (INDEPENDENT_AMBULATORY_CARE_PROVIDER_SITE_OTHER): Payer: Managed Care, Other (non HMO) | Admitting: Family Medicine

## 2013-10-24 ENCOUNTER — Encounter: Payer: Self-pay | Admitting: Family Medicine

## 2013-10-24 ENCOUNTER — Other Ambulatory Visit: Payer: Self-pay | Admitting: General Practice

## 2013-10-24 VITALS — BP 126/80 | HR 110 | Temp 98.2°F | Resp 17 | Wt 214.1 lb

## 2013-10-24 DIAGNOSIS — J069 Acute upper respiratory infection, unspecified: Secondary | ICD-10-CM

## 2013-10-24 DIAGNOSIS — E119 Type 2 diabetes mellitus without complications: Secondary | ICD-10-CM

## 2013-10-24 DIAGNOSIS — E039 Hypothyroidism, unspecified: Secondary | ICD-10-CM

## 2013-10-24 LAB — BASIC METABOLIC PANEL
BUN: 20 mg/dL (ref 6–23)
CALCIUM: 9.3 mg/dL (ref 8.4–10.5)
CO2: 26 mEq/L (ref 19–32)
CREATININE: 0.8 mg/dL (ref 0.4–1.2)
Chloride: 102 mEq/L (ref 96–112)
GFR: 76.12 mL/min (ref >60.00)
Glucose, Bld: 161 mg/dL — ABNORMAL HIGH (ref 70–99)
Potassium: 3.6 mEq/L (ref 3.5–5.1)
Sodium: 138 mEq/L (ref 135–145)

## 2013-10-24 LAB — TSH: TSH: 0.75 u[IU]/mL (ref 0.35–5.50)

## 2013-10-24 LAB — HEMOGLOBIN A1C: Hgb A1c MFr Bld: 6.8 % — ABNORMAL HIGH (ref 4.6–6.5)

## 2013-10-24 MED ORDER — PROMETHAZINE-DM 6.25-15 MG/5ML PO SYRP: 5.0000 mL | ORAL_SOLUTION | Freq: Four times a day (QID) | ORAL | Status: DC | PRN

## 2013-10-24 NOTE — Assessment & Plan Note (Signed)
Chronic problem, pt has lost 20+ lbs since last visit.  Tolerating meds w/out difficulty.  Paying close attention to healthy diet and regular exercise.  Check labs.  Adjust meds prn.

## 2013-10-24 NOTE — Progress Notes (Signed)
   Subjective:    Patient ID: Catherine Castillo, female    DOB: May 17, 1961, 53 y.o.   MRN: [id number]  HPI DM- chronic problem, pt is on Janumet.  Has lost 20+ lbs since Oct.  No side effects from med.  No CP, SOB, HAs, visual changes, N/V/D.  Intermittent numbness in toes.  Hypothyroid- meds were adjusted at last visit.  Due for repeat labs.  URI- cough, nasal congestion, ear fullness bilaterally.  Cough is productive.  No fevers.  + facial pressure.  + sick contacts.  No body aches.   Review of Systems For ROS see HPI     Objective:   Physical Exam  Vitals reviewed. Constitutional: She is oriented to person, place, and time. She appears well-developed and well-nourished. No distress.  HENT:  Head: Normocephalic and atraumatic.  Eyes: Conjunctivae and EOM are normal. Pupils are equal, round, and reactive to light.  Neck: Normal range of motion. Neck supple. No thyromegaly present.  Cardiovascular: Normal rate, regular rhythm, normal heart sounds and intact distal pulses.   No murmur heard. Pulmonary/Chest: Effort normal and breath sounds normal. No respiratory distress.  Abdominal: Soft. She exhibits no distension. There is no tenderness.  Musculoskeletal: She exhibits no edema.  Lymphadenopathy:    She has no cervical adenopathy.  Neurological: She is alert and oriented to person, place, and time.  Skin: Skin is warm and dry.  Psychiatric: She has a normal mood and affect. Her behavior is normal.          Assessment & Plan:

## 2013-10-24 NOTE — Assessment & Plan Note (Signed)
New.  No evidence of bacterial infxn.  Start cough meds prn.  Reviewed supportive care and red flags that should prompt return.  Pt expressed understanding and is in agreement w/ plan.

## 2013-10-24 NOTE — Assessment & Plan Note (Signed)
Chronic problem.  meds were adjusted at last visit.  Currently asymptomatic.  Repeat TSH and adjust meds prn.

## 2013-10-24 NOTE — Patient Instructions (Signed)
Follow up in 3-4 months to recheck sugar We'll notify you of your lab results and make any changes if needed Take the Promethazine cough syrup as needed Drink plenty of fluids Call with any questions or concerns Happy New Year!!!

## 2013-10-24 NOTE — Progress Notes (Signed)
Pre visit review using our clinic review tool, if applicable. No additional management support is needed unless otherwise documented below in the visit note. 

## 2013-10-25 ENCOUNTER — Telehealth: Payer: Self-pay

## 2013-10-25 NOTE — Telephone Encounter (Signed)
Relevant patient education assigned to patient using Emmi. ° °

## 2013-10-28 ENCOUNTER — Encounter: Payer: Self-pay | Admitting: Family Medicine

## 2013-10-28 MED ORDER — GLUCOSE BLOOD VI STRP: ORAL_STRIP | Status: DC

## 2013-10-28 NOTE — Telephone Encounter (Signed)
Med filled.  

## 2013-11-28 LAB — HM DIABETES EYE EXAM

## 2013-12-10 ENCOUNTER — Encounter: Payer: Self-pay | Admitting: Family Medicine

## 2013-12-10 DIAGNOSIS — E119 Type 2 diabetes mellitus without complications: Secondary | ICD-10-CM

## 2013-12-10 MED ORDER — ONETOUCH DELICA LANCETS FINE MISC: 1.0000 | Freq: Two times a day (BID) | Status: DC

## 2013-12-10 MED ORDER — GLUCOSE BLOOD VI STRP: ORAL_STRIP | Status: DC

## 2013-12-10 MED ORDER — LEVOTHYROXINE SODIUM 150 MCG PO TABS: 150.0000 ug | ORAL_TABLET | Freq: Every day | ORAL | Status: DC

## 2013-12-10 MED ORDER — LISINOPRIL 20 MG PO TABS: 20.0000 mg | ORAL_TABLET | Freq: Every day | ORAL | Status: DC

## 2013-12-10 MED ORDER — SITAGLIPTIN PHOS-METFORMIN HCL 50-500 MG PO TABS: 1.0000 | ORAL_TABLET | Freq: Two times a day (BID) | ORAL | Status: DC

## 2013-12-10 NOTE — Telephone Encounter (Signed)
Rx's printed, need tabori to sign.

## 2013-12-11 NOTE — Telephone Encounter (Signed)
Signed prescriptions mailed to patient. JG//CMA

## 2013-12-19 ENCOUNTER — Other Ambulatory Visit: Payer: Self-pay | Admitting: General Practice

## 2013-12-19 MED ORDER — ATORVASTATIN CALCIUM 40 MG PO TABS: ORAL_TABLET | ORAL | Status: DC

## 2013-12-24 ENCOUNTER — Encounter: Payer: Self-pay | Admitting: General Practice

## 2013-12-26 ENCOUNTER — Telehealth: Payer: Self-pay | Admitting: Family Medicine

## 2013-12-26 DIAGNOSIS — E119 Type 2 diabetes mellitus without complications: Secondary | ICD-10-CM

## 2013-12-26 MED ORDER — ONETOUCH DELICA LANCETS FINE MISC: 1.0000 | Freq: Two times a day (BID) | Status: DC

## 2013-12-26 MED ORDER — GLUCOSE BLOOD VI STRP: ORAL_STRIP | Status: DC

## 2013-12-26 NOTE — Telephone Encounter (Signed)
Catherine Castillo from [facility] called regarding the glucose blood (ONE TOUCH ULTRA TEST) test strip and the Lancets prescription . The glucose blood (one touch Ultra Test) can no longer say use as instructed. It needs to have what qty patient needs. Please advise.

## 2013-12-26 NOTE — Telephone Encounter (Signed)
medication resent.

## 2014-01-10 ENCOUNTER — Telehealth: Payer: Self-pay | Admitting: Family Medicine

## 2014-01-10 NOTE — Telephone Encounter (Signed)
[facility] called to check on the status for a medication refill for sitaGLIPtin-metformin (JANUMET) 50-500 MG per tablet

## 2014-01-13 NOTE — Telephone Encounter (Signed)
Prior authorization paperwork faxed to [employer] at [phone]. Awaiting response. JG//CMA

## 2014-01-15 NOTE — Telephone Encounter (Signed)
Prior authorization for Janumet approved from 01/13/2014 through 01/14/2015. Reference number: [id number]. Approval letter sent to be scanned. JG//CMA

## 2014-04-22 ENCOUNTER — Other Ambulatory Visit: Payer: Self-pay | Admitting: *Deleted

## 2014-04-22 DIAGNOSIS — C50512 Malignant neoplasm of lower-outer quadrant of left female breast: Secondary | ICD-10-CM

## 2014-04-22 MED ORDER — TAMOXIFEN CITRATE 20 MG PO TABS: 20.0000 mg | ORAL_TABLET | Freq: Every day | ORAL | Status: DC

## 2014-05-02 ENCOUNTER — Telehealth: Payer: Self-pay

## 2014-05-02 NOTE — Telephone Encounter (Signed)
Per diabetic bundle, pt need to have A1c and LDL rechecked//LMOVM to call back to set up appt.

## 2014-06-09 ENCOUNTER — Encounter: Payer: Self-pay | Admitting: General Practice

## 2014-07-09 LAB — HM MAMMOGRAPHY

## 2014-07-16 ENCOUNTER — Other Ambulatory Visit: Payer: Self-pay | Admitting: Emergency Medicine

## 2014-07-16 DIAGNOSIS — C50512 Malignant neoplasm of lower-outer quadrant of left female breast: Secondary | ICD-10-CM

## 2014-07-17 ENCOUNTER — Other Ambulatory Visit (HOSPITAL_BASED_OUTPATIENT_CLINIC_OR_DEPARTMENT_OTHER): Payer: Managed Care, Other (non HMO)

## 2014-07-17 ENCOUNTER — Ambulatory Visit (HOSPITAL_BASED_OUTPATIENT_CLINIC_OR_DEPARTMENT_OTHER): Payer: Managed Care, Other (non HMO) | Admitting: Oncology

## 2014-07-17 VITALS — BP 139/83 | HR 82 | Temp 97.5°F | Resp 18 | Ht 69.25 in | Wt 229.6 lb

## 2014-07-17 DIAGNOSIS — C50512 Malignant neoplasm of lower-outer quadrant of left female breast: Secondary | ICD-10-CM

## 2014-07-17 DIAGNOSIS — Z170 Estrogen receptor positive status [ER+]: Secondary | ICD-10-CM

## 2014-07-17 DIAGNOSIS — E119 Type 2 diabetes mellitus without complications: Secondary | ICD-10-CM

## 2014-07-17 LAB — COMPREHENSIVE METABOLIC PANEL (CC13)
ALT: 26 U/L (ref 0–55)
ANION GAP: 9 meq/L (ref 3–11)
AST: 20 U/L (ref 5–34)
Albumin: 3.8 g/dL (ref 3.5–5.0)
Alkaline Phosphatase: 57 U/L (ref 40–150)
BILIRUBIN TOTAL: 0.48 mg/dL (ref 0.20–1.20)
BUN: 21.4 mg/dL (ref 7.0–26.0)
CO2: 25 meq/L (ref 22–29)
Calcium: 9.8 mg/dL (ref 8.4–10.4)
Chloride: 105 mEq/L (ref 98–109)
Creatinine: 0.8 mg/dL (ref 0.6–1.1)
Glucose: 138 mg/dl (ref 70–140)
Potassium: 3.9 mEq/L (ref 3.5–5.1)
SODIUM: 140 meq/L (ref 136–145)
TOTAL PROTEIN: 7.2 g/dL (ref 6.4–8.3)

## 2014-07-17 LAB — CBC WITH DIFFERENTIAL/PLATELET
BASO%: 0.3 % (ref 0.0–2.0)
Basophils Absolute: 0 10*3/uL (ref 0.0–0.1)
EOS ABS: 0.2 10*3/uL (ref 0.0–0.5)
EOS%: 2.6 % (ref 0.0–7.0)
HCT: 37.3 % (ref 34.8–46.6)
HGB: 12.5 g/dL (ref 11.6–15.9)
LYMPH%: 34 % (ref 14.0–49.7)
MCH: 29.6 pg (ref 25.1–34.0)
MCHC: 33.5 g/dL (ref 31.5–36.0)
MCV: 88.4 fL (ref 79.5–101.0)
MONO#: 0.6 10*3/uL (ref 0.1–0.9)
MONO%: 7.8 % (ref 0.0–14.0)
NEUT%: 55.3 % (ref 38.4–76.8)
NEUTROS ABS: 4.4 10*3/uL (ref 1.5–6.5)
NRBC: 0 % (ref 0–0)
Platelets: 228 10*3/uL (ref 145–400)
RBC: 4.22 10*6/uL (ref 3.70–5.45)
RDW: 13.3 % (ref 11.2–14.5)
WBC: 8 10*3/uL (ref 3.9–10.3)
lymph#: 2.7 10*3/uL (ref 0.9–3.3)

## 2014-07-17 NOTE — Progress Notes (Signed)
ID: Catherine Castillo   PCP: Catherine Asa, MD GYN: Catherine Castillo SU:  OTHER MD:  Interval History:  Catherine Castillo returns today for followup of her breast cancer. She continues on tamoxifen, with some hot flashes and occasional vaginal wetness as the only side effects.  ROS:  A detailed review of systems today is otherwise entirely negative. She is planning to walk 60 miles in the "komen challenge" next week in [place]  Medications: I have reviewed the patient's current medications.  Current Outpatient Prescriptions  Medication Sig Dispense Refill  . atorvastatin (LIPITOR) 40 MG tablet TAKE 1 TABLET AT BEDTIME  90 tablet  1  . glucose blood (ONE TOUCH ULTRA TEST) test strip Please use one strip each time glucose levels are tested. Pt tests 2 times daily.  100 each  12  . levothyroxine (SYNTHROID, LEVOTHROID) 150 MCG tablet Take 1 tablet (150 mcg total) by mouth daily.  90 tablet  1  . lisinopril (PRINIVIL,ZESTRIL) 20 MG tablet Take 1 tablet (20 mg total) by mouth daily.  90 tablet  1  . Multiple Vitamin (MULTIVITAMIN WITH MINERALS) TABS Take 1 tablet by mouth daily.      Catherine Castillo DELICA LANCETS FINE MISC 1 each by Does not apply route 2 (two) times daily.  100 each  11  . Probiotic Product (ALIGN) 4 MG CAPS Take by mouth daily.        . promethazine-dextromethorphan (PROMETHAZINE-DM) 6.25-15 MG/5ML syrup Take 5 mLs by mouth 4 (four) times daily as needed.  240 mL  0  . sitaGLIPtin-metformin (JANUMET) 50-500 MG per tablet Take 1 tablet by mouth 2 (two) times daily with a meal.  180 tablet  1  . tamoxifen (NOLVADEX) 20 MG tablet Take 1 tablet (20 mg total) by mouth daily.  90 tablet  3   No current facility-administered medications for this visit.   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.; lives by herself, her mother stays w her 1/2 of every  year  Objective: Middle-aged white woman in no acute distress Filed Vitals:   07/17/14 0955  BP: 139/83  Pulse: 82  Temp: 97.5 F (36.4 Castillo)  Resp: 18   ECPG: 0  Physical Exam:   Sclerae unicteric, pupils equal and reactive Oropharynx clear, teeth in good repair No cervical or supraclavicular adenopathy Lungs no rales or rhonchi Heart regular rate and rhythm Abd soft, nontender, positive bowel sounds MSK no focal spinal tenderness, no upper extremity lymphedema Neuro: nonfocal, well oriented, appropriate affect Breasts: The right breast is unremarkable; the left breast is status post lumpectomy and radiation. There is no evidence of local recurrence. The left axilla is benign.   Lab Results:   CMP    Chemistry      Component Value Date/Time   NA 140 07/17/2014 0925   NA 138 10/24/2013 1133   K 3.9 07/17/2014 0925   K 3.6 10/24/2013 1133   CL 102 10/24/2013 1133   CL 103 07/23/2012 0803   CO2 25 07/17/2014 0925   CO2 26 10/24/2013 1133   BUN 21.4 07/17/2014 0925   BUN 20 10/24/2013 1133   CREATININE 0.8 07/17/2014 0925   CREATININE 0.8 10/24/2013 1133      Component Value Date/Time   CALCIUM 9.8 07/17/2014 0925   CALCIUM 9.3 10/24/2013 1133   ALKPHOS 57 07/17/2014 0925   ALKPHOS 58 02/25/2013 0959  AST 20 07/17/2014 0925   AST 43* 02/25/2013 0959   ALT 26 07/17/2014 0925   ALT 58* 02/25/2013 0959   BILITOT 0.48 07/17/2014 0925   BILITOT 0.6 02/25/2013 0959       CBC Lab Results  Component Value Date   WBC 8.0 07/17/2014   HGB 12.5 07/17/2014   HCT 37.3 07/17/2014   MCV 88.4 07/17/2014   PLT 228 07/17/2014   NEUTROABS 4.4 07/17/2014    Studies/Results:  She had mammography at [facility] 07/09/2014, with no suspicious findings  Assessment: 53 y.o.  [name] woman status post left lumpectomy and sentinel lymph node sampling October of 2010 for a T1b N0 (Stage I) invasive ductal carcinoma, grade 1, estrogen and progesterone receptor positive, HER-2 not amplified, with an MIB-1-1 of  14%;   (1) status post radiation completed December of 2010  (2) started tamoxifen December 2010   Plan:  Catherine Castillo will complete her 5 years of tamoxifen within the next 6 weeks. We discussed again the fact that for patients who have larger tumors or node positivity, 10 years of tamoxifen is recommended. In her case, 5 years is sufficient. Her risk of recurrence of this cancer is currently in the less than 5% range.  Accordingly I am comfortable releasing her back to her primary care physician's care. As far as breast cancer screening is concerned she will need yearly mammography, which she usually obtains in September at [facility], and a yearly physician breast exam.  I will be glad to see Catherine Castillo again at any point in the future if and when the need arises, but as of now we are making no further routine appointment for her here.  Catherine Castillo 07/17/2014

## 2014-07-18 ENCOUNTER — Telehealth: Payer: Self-pay | Admitting: Oncology

## 2014-07-18 NOTE — Telephone Encounter (Signed)
Sent letter to Dr. Annye Asa office from Dr. Jana Hakim

## 2014-07-21 ENCOUNTER — Encounter: Payer: Self-pay | Admitting: General Practice

## 2014-07-29 ENCOUNTER — Encounter: Payer: Self-pay | Admitting: Family Medicine

## 2014-07-29 MED ORDER — LEVOTHYROXINE SODIUM 150 MCG PO TABS: 150.0000 ug | ORAL_TABLET | Freq: Every day | ORAL | Status: DC

## 2014-07-29 NOTE — Telephone Encounter (Signed)
Med filled.  

## 2014-08-06 ENCOUNTER — Encounter: Payer: Self-pay | Admitting: Family Medicine

## 2014-08-06 ENCOUNTER — Ambulatory Visit (INDEPENDENT_AMBULATORY_CARE_PROVIDER_SITE_OTHER): Payer: Managed Care, Other (non HMO) | Admitting: Family Medicine

## 2014-08-06 VITALS — BP 132/86 | HR 82 | Temp 98.1°F | Resp 16 | Ht 69.0 in | Wt 226.0 lb

## 2014-08-06 DIAGNOSIS — E119 Type 2 diabetes mellitus without complications: Secondary | ICD-10-CM

## 2014-08-06 DIAGNOSIS — Z23 Encounter for immunization: Secondary | ICD-10-CM

## 2014-08-06 DIAGNOSIS — Z0000 Encounter for general adult medical examination without abnormal findings: Secondary | ICD-10-CM

## 2014-08-06 LAB — HEMOGLOBIN A1C: HEMOGLOBIN A1C: 6.9 % — AB (ref 4.6–6.5)

## 2014-08-06 LAB — CBC WITH DIFFERENTIAL/PLATELET
BASOS ABS: 0 10*3/uL (ref 0.0–0.1)
Basophils Relative: 0.6 % (ref 0.0–3.0)
EOS ABS: 0.2 10*3/uL (ref 0.0–0.7)
Eosinophils Relative: 2.7 % (ref 0.0–5.0)
HCT: 41.5 % (ref 36.0–46.0)
HEMOGLOBIN: 13.6 g/dL (ref 12.0–15.0)
LYMPHS PCT: 26.8 % (ref 12.0–46.0)
Lymphs Abs: 2.2 10*3/uL (ref 0.7–4.0)
MCHC: 32.8 g/dL (ref 30.0–36.0)
MCV: 88.4 fl (ref 78.0–100.0)
MONOS PCT: 6.5 % (ref 3.0–12.0)
Monocytes Absolute: 0.5 10*3/uL (ref 0.1–1.0)
NEUTROS PCT: 63.4 % (ref 43.0–77.0)
Neutro Abs: 5.3 10*3/uL (ref 1.4–7.7)
PLATELETS: 335 10*3/uL (ref 150.0–400.0)
RBC: 4.69 Mil/uL (ref 3.87–5.11)
RDW: 13.6 % (ref 11.5–15.5)
WBC: 8.3 10*3/uL (ref 4.0–10.5)

## 2014-08-06 LAB — VITAMIN D 25 HYDROXY (VIT D DEFICIENCY, FRACTURES): VITD: 39.53 ng/mL (ref 30.00–100.00)

## 2014-08-06 MED ORDER — ATORVASTATIN CALCIUM 40 MG PO TABS: ORAL_TABLET | ORAL | Status: DC

## 2014-08-06 MED ORDER — LEVOTHYROXINE SODIUM 150 MCG PO TABS: 150.0000 ug | ORAL_TABLET | Freq: Every day | ORAL | Status: DC

## 2014-08-06 MED ORDER — LISINOPRIL 20 MG PO TABS: 20.0000 mg | ORAL_TABLET | Freq: Every day | ORAL | Status: DC

## 2014-08-06 MED ORDER — SITAGLIPTIN PHOS-METFORMIN HCL 50-500 MG PO TABS: 1.0000 | ORAL_TABLET | Freq: Two times a day (BID) | ORAL | Status: DC

## 2014-08-06 NOTE — Patient Instructions (Signed)
Follow up in 3-4 months to recheck diabetes We'll notify you of your lab results and make any changes if needed Keep up the good work!  You look great! Call with any questions or concerns Happy Fall!!!

## 2014-08-06 NOTE — Assessment & Plan Note (Signed)
Pt's PE WNL w/ exception of obesity.  UTD on GYN, colonoscopy.  Check labs.  Anticipatory guidance provided.  

## 2014-08-06 NOTE — Progress Notes (Signed)
   Subjective:    Patient ID: Catherine Castillo, female    DOB: August 26, 1961, 53 y.o.   MRN: [id number]  HPI CPE- UTD on pap (Dr Mancel Bale), mammo, colonoscopy.  UTD on diabetic eye exam.   Review of Systems Patient reports no vision/ hearing changes, adenopathy,fever, weight change,  persistant/recurrent hoarseness , swallowing issues, chest pain, palpitations, edema, persistant/recurrent cough, hemoptysis, dyspnea (rest/exertional/paroxysmal nocturnal), gastrointestinal bleeding (melena, rectal bleeding), abdominal pain, significant heartburn, bowel changes, GU symptoms (dysuria, hematuria, incontinence), Gyn symptoms (abnormal  bleeding, pain),  syncope, focal weakness, memory loss, numbness & tingling, skin/hair/nail changes, abnormal bruising or bleeding, anxiety, or depression.     Objective:   Physical Exam General Appearance:    Alert, cooperative, no distress, appears stated age  Head:    Normocephalic, without obvious abnormality, atraumatic  Eyes:    PERRL, conjunctiva/corneas clear, EOM's intact, fundi    benign, both eyes  Ears:    Normal TM's and external ear canals, both ears  Nose:   Nares normal, septum midline, mucosa normal, no drainage    or sinus tenderness  Throat:   Lips, mucosa, and tongue normal; teeth and gums normal  Neck:   Supple, symmetrical, trachea midline, no adenopathy;    Thyroid: no enlargement/tenderness/nodules  Back:     Symmetric, no curvature, ROM normal, no CVA tenderness  Lungs:     Clear to auscultation bilaterally, respirations unlabored  Chest Wall:    No tenderness or deformity   Heart:    Regular rate and rhythm, S1 and S2 normal, no murmur, rub   or gallop  Breast Exam:    Deferred to GYN  Abdomen:     Soft, non-tender, bowel sounds active all four quadrants,    no masses, no organomegaly  Genitalia:    Deferred to GYN  Rectal:    Extremities:   Extremities normal, atraumatic, no cyanosis or edema  Pulses:   2+ and symmetric all  extremities  Skin:   Skin color, texture, turgor normal, no rashes or lesions  Lymph nodes:   Cervical, supraclavicular, and axillary nodes normal  Neurologic:   CNII-XII intact, normal strength, sensation and reflexes    throughout          Assessment & Plan:

## 2014-08-06 NOTE — Assessment & Plan Note (Signed)
Chronic problem.  Tolerating meds w/o difficulty.  UTD on eye exam.  Check labs.  Adjust meds prn

## 2014-08-06 NOTE — Progress Notes (Signed)
Pre visit review using our clinic review tool, if applicable. No additional management support is needed unless otherwise documented below in the visit note. 

## 2014-08-07 LAB — LIPID PANEL
Cholesterol: 224 mg/dL — ABNORMAL HIGH (ref 0–200)
HDL: 46.3 mg/dL (ref >39.00)
NONHDL: 177.7
Total CHOL/HDL Ratio: 5
Triglycerides: 272 mg/dL — ABNORMAL HIGH (ref 0.0–149.0)
VLDL: 54.4 mg/dL — ABNORMAL HIGH (ref 0.0–40.0)

## 2014-08-07 LAB — HEPATIC FUNCTION PANEL
ALBUMIN: 3.7 g/dL (ref 3.5–5.2)
ALT: 35 U/L (ref 0–35)
AST: 31 U/L (ref 0–37)
Alkaline Phosphatase: 56 U/L (ref 39–117)
BILIRUBIN DIRECT: 0.1 mg/dL (ref 0.0–0.3)
TOTAL PROTEIN: 7.8 g/dL (ref 6.0–8.3)
Total Bilirubin: 0.7 mg/dL (ref 0.2–1.2)

## 2014-08-07 LAB — BASIC METABOLIC PANEL
BUN: 18 mg/dL (ref 6–23)
CALCIUM: 9.9 mg/dL (ref 8.4–10.5)
CO2: 26 mEq/L (ref 19–32)
CREATININE: 0.7 mg/dL (ref 0.4–1.2)
Chloride: 99 mEq/L (ref 96–112)
GFR: 89.43 mL/min (ref >60.00)
Glucose, Bld: 123 mg/dL — ABNORMAL HIGH (ref 70–99)
Potassium: 4.2 mEq/L (ref 3.5–5.1)
Sodium: 136 mEq/L (ref 135–145)

## 2014-08-07 LAB — LDL CHOLESTEROL, DIRECT: LDL DIRECT: 127.9 mg/dL

## 2014-08-07 LAB — TSH: TSH: 1.95 u[IU]/mL (ref 0.35–4.50)

## 2014-08-16 ENCOUNTER — Encounter: Payer: Self-pay | Admitting: Family Medicine

## 2014-08-18 MED ORDER — LEVOTHYROXINE SODIUM 150 MCG PO TABS: 150.0000 ug | ORAL_TABLET | Freq: Every day | ORAL | Status: DC

## 2014-08-18 NOTE — Telephone Encounter (Signed)
Med filled.  

## 2014-11-06 ENCOUNTER — Ambulatory Visit: Payer: Managed Care, Other (non HMO) | Admitting: Family Medicine

## 2014-11-27 LAB — HM DIABETES EYE EXAM

## 2014-12-15 LAB — HM PAP SMEAR: HM Pap smear: NORMAL

## 2014-12-16 ENCOUNTER — Encounter: Payer: Self-pay | Admitting: General Practice

## 2015-02-06 ENCOUNTER — Encounter: Payer: Self-pay | Admitting: General Practice

## 2015-02-17 ENCOUNTER — Encounter: Payer: Self-pay | Admitting: Family Medicine

## 2015-02-17 MED ORDER — LEVOTHYROXINE SODIUM 150 MCG PO TABS: 150.0000 ug | ORAL_TABLET | Freq: Every day | ORAL | Status: DC

## 2015-02-17 MED ORDER — LISINOPRIL 20 MG PO TABS: 20.0000 mg | ORAL_TABLET | Freq: Every day | ORAL | Status: DC

## 2015-02-17 NOTE — Telephone Encounter (Signed)
Med filled.  

## 2015-03-25 ENCOUNTER — Encounter: Payer: Self-pay | Admitting: Family Medicine

## 2015-03-25 MED ORDER — ATORVASTATIN CALCIUM 40 MG PO TABS: ORAL_TABLET | ORAL | Status: DC

## 2015-03-25 MED ORDER — SITAGLIPTIN PHOS-METFORMIN HCL 50-500 MG PO TABS: 1.0000 | ORAL_TABLET | Freq: Two times a day (BID) | ORAL | Status: DC

## 2015-03-25 NOTE — Telephone Encounter (Signed)
meds filled

## 2015-04-13 ENCOUNTER — Other Ambulatory Visit: Payer: Self-pay

## 2015-04-28 ENCOUNTER — Encounter: Payer: Self-pay | Admitting: Genetic Counselor

## 2015-05-12 ENCOUNTER — Telehealth: Payer: Self-pay | Admitting: *Deleted

## 2015-05-12 NOTE — Telephone Encounter (Signed)
PA for Janumet initaited. Awaiting determination. JG/CMA

## 2015-05-13 ENCOUNTER — Encounter: Payer: Self-pay | Admitting: Family Medicine

## 2015-05-18 NOTE — Telephone Encounter (Signed)
PA approved.

## 2015-07-01 ENCOUNTER — Encounter: Payer: Self-pay | Admitting: Family Medicine

## 2015-07-01 ENCOUNTER — Ambulatory Visit (INDEPENDENT_AMBULATORY_CARE_PROVIDER_SITE_OTHER): Payer: No Typology Code available for payment source | Admitting: Family Medicine

## 2015-07-01 ENCOUNTER — Other Ambulatory Visit: Payer: Self-pay | Admitting: Family Medicine

## 2015-07-01 VITALS — BP 130/80 | HR 98 | Temp 98.0°F | Resp 16 | Ht 69.0 in | Wt 231.1 lb

## 2015-07-01 DIAGNOSIS — I10 Essential (primary) hypertension: Secondary | ICD-10-CM

## 2015-07-01 DIAGNOSIS — E1169 Type 2 diabetes mellitus with other specified complication: Secondary | ICD-10-CM

## 2015-07-01 DIAGNOSIS — E785 Hyperlipidemia, unspecified: Secondary | ICD-10-CM | POA: Diagnosis not present

## 2015-07-01 DIAGNOSIS — K439 Ventral hernia without obstruction or gangrene: Secondary | ICD-10-CM | POA: Diagnosis not present

## 2015-07-01 DIAGNOSIS — R945 Abnormal results of liver function studies: Secondary | ICD-10-CM

## 2015-07-01 DIAGNOSIS — E119 Type 2 diabetes mellitus without complications: Secondary | ICD-10-CM | POA: Diagnosis not present

## 2015-07-01 DIAGNOSIS — R7989 Other specified abnormal findings of blood chemistry: Secondary | ICD-10-CM

## 2015-07-01 LAB — CBC WITH DIFFERENTIAL/PLATELET
BASOS ABS: 0 10*3/uL (ref 0.0–0.1)
BASOS PCT: 0.3 % (ref 0.0–3.0)
EOS ABS: 0.2 10*3/uL (ref 0.0–0.7)
Eosinophils Relative: 2.8 % (ref 0.0–5.0)
HEMATOCRIT: 40.7 % (ref 36.0–46.0)
HEMOGLOBIN: 13.7 g/dL (ref 12.0–15.0)
LYMPHS PCT: 28.6 % (ref 12.0–46.0)
Lymphs Abs: 2.3 10*3/uL (ref 0.7–4.0)
MCHC: 33.7 g/dL (ref 30.0–36.0)
MCV: 86.5 fl (ref 78.0–100.0)
MONOS PCT: 6 % (ref 3.0–12.0)
Monocytes Absolute: 0.5 10*3/uL (ref 0.1–1.0)
NEUTROS ABS: 5 10*3/uL (ref 1.4–7.7)
Neutrophils Relative %: 62.3 % (ref 43.0–77.0)
PLATELETS: 346 10*3/uL (ref 150.0–400.0)
RBC: 4.71 Mil/uL (ref 3.87–5.11)
RDW: 13.9 % (ref 11.5–15.5)
WBC: 8 10*3/uL (ref 4.0–10.5)

## 2015-07-01 LAB — BASIC METABOLIC PANEL
BUN: 19 mg/dL (ref 6–23)
CO2: 30 mEq/L (ref 19–32)
Calcium: 10.1 mg/dL (ref 8.4–10.5)
Chloride: 101 mEq/L (ref 96–112)
Creatinine, Ser: 0.56 mg/dL (ref 0.40–1.20)
GFR: 119.12 mL/min (ref >60.00)
Glucose, Bld: 136 mg/dL — ABNORMAL HIGH (ref 70–99)
Potassium: 4.8 mEq/L (ref 3.5–5.1)
SODIUM: 141 meq/L (ref 135–145)

## 2015-07-01 LAB — LIPID PANEL
CHOL/HDL RATIO: 4
Cholesterol: 181 mg/dL (ref 0–200)
HDL: 41.7 mg/dL (ref >39.00)
LDL Cholesterol: 112 mg/dL — ABNORMAL HIGH (ref 0–99)
NONHDL: 139.02
Triglycerides: 136 mg/dL (ref 0.0–149.0)
VLDL: 27.2 mg/dL (ref 0.0–40.0)

## 2015-07-01 LAB — HEMOGLOBIN A1C: HEMOGLOBIN A1C: 7.9 % — AB (ref 4.6–6.5)

## 2015-07-01 LAB — HEPATIC FUNCTION PANEL
ALT: 57 U/L — ABNORMAL HIGH (ref 0–35)
AST: 33 U/L (ref 0–37)
Albumin: 4.3 g/dL (ref 3.5–5.2)
Alkaline Phosphatase: 90 U/L (ref 39–117)
BILIRUBIN DIRECT: 0.1 mg/dL (ref 0.0–0.3)
BILIRUBIN TOTAL: 0.5 mg/dL (ref 0.2–1.2)
TOTAL PROTEIN: 7.7 g/dL (ref 6.0–8.3)

## 2015-07-01 LAB — TSH: TSH: 1.49 u[IU]/mL (ref 0.35–4.50)

## 2015-07-01 NOTE — Assessment & Plan Note (Signed)
Chronic problem.  UTD on eye exam, foot exam done today.  On ACE for renal protection.  Admits to poor dietary compliance.  Stressed need for low carb diet and regular exercise.  Activated and printed Janumet coupon card for pt.  Check labs.  Adjust meds prn

## 2015-07-01 NOTE — Assessment & Plan Note (Signed)
New.  Pt is asymptomatic- no pain- but there is clearly an abdominal bulge that worsens w/ increased intra-abdominal pressure.  Refer to general surgery for complete evaluation and tx.

## 2015-07-01 NOTE — Patient Instructions (Signed)
Follow up in 3-4 months to recheck diabetes We'll notify you of your lab results and make any changes if needed Present the Janumet Coupon to the Pharmacy and let me know if you run into any trouble Continue to work on healthy diet and regular exercise We'll call you with your surgery appt for the hernia Call with any questions or concerns If you want to join [place] at the new [place] office, any scheduled appointments will automatically transfer and we will see you at [address] [place] [address] [place], [place] [address]  Have a great fall!!!

## 2015-07-01 NOTE — Progress Notes (Signed)
Pre visit review using our clinic review tool, if applicable. No additional management support is needed unless otherwise documented below in the visit note. 

## 2015-07-01 NOTE — Progress Notes (Signed)
   Subjective:    Patient ID: Catherine Castillo, female    DOB: 22-Mar-1961, 54 y.o.   MRN: [id number]  HPI DM- chronic problem, on Janumet.  UTD on eye exam.  On ACE for renal protection.  Denies symptomatic lows.  Admits to poor dietary habits.  AM CBGs 'are now higher than they've ever been'.  Today was 157.  Pt is having difficulty w/ Janumet due to cost.  HTN- chronic problem, on Lisinopril.  Well controlled.  Denies CP, SOB, HAs, visual changes, edema.  Hyperlipidemia- chronic problem, on Lipitor.  Denies abd pain, N/V, myalgias.  Ventral hernia- pt notes abdominal bulge just L of midline incision.  Protrudes w/ cough, laugh, sneeze.  No pain.   Review of Systems For ROS see HPI     Objective:   Physical Exam  Constitutional: She is oriented to person, place, and time. She appears well-developed and well-nourished. No distress.  HENT:  Head: Normocephalic and atraumatic.  Eyes: Conjunctivae and EOM are normal. Pupils are equal, round, and reactive to light.  Neck: Normal range of motion. Neck supple. No thyromegaly present.  Cardiovascular: Normal rate, regular rhythm, normal heart sounds and intact distal pulses.   No murmur heard. Pulmonary/Chest: Effort normal and breath sounds normal. No respiratory distress.  Abdominal: Soft. She exhibits no distension. There is no tenderness.  Large ventral hernia just left of midline  Musculoskeletal: She exhibits no edema.  Lymphadenopathy:    She has no cervical adenopathy.  Neurological: She is alert and oriented to person, place, and time.  Skin: Skin is warm and dry.  Psychiatric: She has a normal mood and affect. Her behavior is normal.  Vitals reviewed.         Assessment & Plan:

## 2015-07-01 NOTE — Assessment & Plan Note (Signed)
Chronic problem.  Adequate control.  Asymptomatic.  Check labs.  No anticipated med changes 

## 2015-07-01 NOTE — Assessment & Plan Note (Signed)
Chronic problem.  Tolerating statin w/o difficulty.  Check labs.  Adjust meds prn  

## 2015-07-26 ENCOUNTER — Other Ambulatory Visit: Payer: Self-pay | Admitting: Family Medicine

## 2015-07-27 ENCOUNTER — Ambulatory Visit: Payer: Self-pay | Admitting: Surgery

## 2015-07-27 NOTE — Telephone Encounter (Signed)
Medication Detail      Disp Refills Start End     levothyroxine (SYNTHROID, LEVOTHROID) 150 MCG tablet 30 tablet 6 02/17/2015     Sig - Route: Take 1 tablet (150 mcg total) by mouth daily. Dispense Synthroid Brand name. DAW I - Oral    E-Prescribing Status: Receipt confirmed by pharmacy (02/17/2015 1:48 PM EDT)     Pharmacy    [id number] - [place], [address] AT [address]   Refill sent per [facility] refill protocol/SLS

## 2015-07-27 NOTE — H&P (Signed)
Catherine Castillo 07/27/2015 9:47 AM Location: [facility] Patient #: [id number] DOB: 02/11/1961 Single / Language: [name] / Race: White Female History of Present Illness Catherine Moores A. Janiesha Diehl MD; 07/27/2015 3:50 PM) Patient words: Pt presents for incisional hernia. She has eveloped a bulge at the superior portion if her laparotomy incision. She noticed the bulge when she coughs or sneezes. She had a colectomy and colostomy reversal in 2011. She is having mild to moderate pain around the hernia site. NO NAUSEA OR VOMITING. Bowels normal.  The patient is a 54 year old female   Other Problems [name], RMA; 07/27/2015 9:47 AM) Arthritis Breast Cancer Diabetes Mellitus Diverticulosis High blood pressure Hypercholesterolemia Lump In Breast Migraine Headache Thyroid Disease  Past Surgical History [name], RMA; 07/27/2015 9:47 AM) Breast Biopsy Left. Breast Mass; Local Excision Left. Colon Polyp Removal - Colonoscopy Colon Removal - Partial Knee Surgery Bilateral. Oral Surgery Sentinel Lymph Node Biopsy Tonsillectomy  Diagnostic Studies History [name], RMA; 07/27/2015 9:47 AM) Colonoscopy 1-5 years ago Mammogram within last year Pap Smear 1-5 years ago  Allergies [name], RMA; 07/27/2015 9:48 AM) Penicillin G Pot in Dextrose *PENICILLINS* Amoxil *PENICILLINS*  Medication History ([name], RMA; 07/27/2015 9:51 AM) Atorvastatin Calcium (40MG  Tablet, Oral) Active. Janumet (50-500MG  Tablet, Oral) Active. Lisinopril (20MG  Tablet, Oral) Active. Synthroid (150MCG Tablet, Oral) Active. Lipoic Acid (100MG  Capsule, Oral) Active. Vitamin C (100MG  Tablet, Oral) Active. Chromium (1MG  Capsule, Oral) Active. Fish Oil + D3 (1200-1000MG -UNIT Capsule, Oral) Active. Probiotic Acidophilus (Oral) Active. Magnesium Aspartate Dihydrate Active. Multi-Day (Oral) Active. Medications Reconciled  Social History [name], RMA; 07/27/2015 9:47 AM) Alcohol use Occasional alcohol use. Caffeine use Carbonated beverages, Coffee. No drug use Tobacco use Former smoker.  Family History [name], [place]; 07/27/2015 9:47 AM) Cancer Father. Diabetes Mellitus Father. Heart Disease Father. Hypertension Father, Sister.  Pregnancy / Birth History [name], RMA; 07/27/2015 9:47 AM) Age at menarche 70 years. Age of menopause 60-55 Gravida 0 Para 0     Review of Systems ([place]; 07/27/2015 9:47 AM) General Present- Night Sweats. Not Present- Appetite Loss, Chills, Fatigue, Fever, Weight Gain and Weight Loss. Skin Not Present- Change in Wart/Mole, Dryness, Hives, Jaundice, New Lesions, Non-Healing Wounds, Rash and Ulcer. HEENT Present- Seasonal Allergies and Wears glasses/contact lenses. Not Present- Earache, Hearing Loss, Hoarseness, Nose Bleed, Oral Ulcers, Ringing in the Ears, Sinus Pain, Sore Throat, Visual Disturbances and Yellow Eyes. Respiratory Not Present- Bloody sputum, Chronic Cough, Difficulty Breathing, Snoring and Wheezing. Breast Not Present- Breast Mass, Breast Pain, Nipple Discharge and Skin Changes. Cardiovascular Not Present- Chest Pain, Difficulty Breathing Lying Down, Leg Cramps, Palpitations, Rapid Heart Rate, Shortness of Breath and Swelling of Extremities. Gastrointestinal Not Present- Abdominal Pain, Bloating, Bloody Stool, Change in Bowel Habits, Chronic diarrhea, Constipation, Difficulty Swallowing, Excessive gas, Gets full quickly at meals, Hemorrhoids, Indigestion, Nausea, Rectal Pain and Vomiting. Female Genitourinary Not Present- Frequency, Nocturia, Painful Urination, Pelvic Pain and Urgency. Musculoskeletal Not Present- Back Pain, Joint Pain, Joint Stiffness, Muscle Pain, Muscle Weakness and Swelling of Extremities. Neurological Not Present- Decreased Memory, Fainting, Headaches, Numbness, Seizures, Tingling, Tremor, Trouble walking and  Weakness. Psychiatric Not Present- Anxiety, Bipolar, Change in Sleep Pattern, Depression, Fearful and Frequent crying. Endocrine Not Present- Cold Intolerance, Excessive Hunger, Hair Changes, Heat Intolerance, Hot flashes and New Diabetes. Hematology Not Present- Easy Bruising, Excessive bleeding, Gland problems, HIV and Persistent Infections.  Vitals ([name] RMA; 07/27/2015 9:51 AM) 07/27/2015 9:51 AM Weight: 231 lb Height: 69in Body Surface Area:  2.26 m Body Mass Index: 34.11 kg/m Temp.: 18F  Pulse: 64 (Regular)  BP: 120/80 (Sitting, Left Arm, Standard)     Physical Exam (Evelina Lore A. Rory Montel MD; 07/27/2015 3:52 PM)  General Mental Status-Alert. General Appearance-Consistent with stated age. Hydration-Well hydrated. Voice-Normal.  Head and Neck Head-normocephalic, atraumatic with no lesions or palpable masses. Trachea-midline. Thyroid Gland Characteristics - normal size and consistency.  Chest and Lung Exam Chest and lung exam reveals -quiet, even and easy respiratory effort with no use of accessory muscles and on auscultation, normal breath sounds, no adventitious sounds and normal vocal resonance. Inspection Chest Wall - Normal. Back - normal.  Cardiovascular Cardiovascular examination reveals -normal heart sounds, regular rate and rhythm with no murmurs and normal pedal pulses bilaterally.  Abdomen Note: midline scar noted  reducible hernia upper incision site and around old colostomy site each about 3 cm    Neurologic Neurologic evaluation reveals -alert and oriented x 3 with no impairment of recent or remote memory. Mental Status-Normal.  Musculoskeletal Normal Exam - Left-Upper Extremity Strength Normal and Lower Extremity Strength Normal. Normal Exam - Right-Upper Extremity Strength Normal and Lower Extremity Strength Normal.    Assessment & Plan (Kaylyne Axton A. Alwilda Gilland MD; 07/27/2015 3:53 PM)  INCISIONAL HERNIA  (K43.2) Impression: discussed repair and risk factors for recurrence including obesity. Discussed laparoscopic and open repair with mesh The risk of hernia repair include bleeding, infection, organ injury, bowel injury, bladder injury, nerve injury recurrent hernia, blood clots, worsening of underlying condition, chronic pain, mesh use, bowel injury bowel resection open surgery, death, and the need for other operattions. Pt agrees to proceed  Current Plans   The anatomy & physiology of the abdominal wall was discussed. The pathophysiology of hernias was discussed. Natural history risks without surgery including progeressive enlargement, pain, incarceration, & strangulation was discussed. Contributors to complications such as smoking, obesity, diabetes, prior surgery, etc were discussed.  I feel the risks of no intervention will lead to serious problems that outweigh the operative risks; therefore, I recommended surgery to reduce and repair the hernia. I explained laparoscopic techniques with possible need for an open approach. I noted the probable use of mesh to patch and/or buttress the hernia repair  Risks such as bleeding, infection, abscess, need for further treatment, heart attack, death, and other risks were discussed. I noted a good likelihood this will help address the problem. Goals of post-operative recovery were discussed as well. Possibility that this will not correct all symptoms was explained. I stressed the importance of low-impact activity, aggressive pain control, avoiding constipation, & not pushing through pain to minimize risk of post-operative chronic pain or injury. Possibility of reherniation especially with smoking, obesity, diabetes, immunosuppression, and other health conditions was discussed. We will work to minimize complications.  An educational handout further explaining the pathology & treatment options was given as well. Questions were answered. The patient expresses  understanding & wishes to proceed with surgery. Pt Education - Incisional hernia: discussed with patient and provided information. Pt Education - [facility] Laparosopic Post Op HCI (Gross) Pt Education - [facility] Hernia Post-Op HCI (Gross): discussed with patient and provided information. OBESITY, MORBID (E66.01)  MORBID OBESITY DUE TO EXCESS CALORIES (E66.01)

## 2015-08-17 ENCOUNTER — Other Ambulatory Visit: Payer: Self-pay

## 2015-08-17 ENCOUNTER — Encounter (HOSPITAL_COMMUNITY): Payer: Self-pay

## 2015-08-17 ENCOUNTER — Encounter (HOSPITAL_COMMUNITY)
Admission: RE | Admit: 2015-08-17 | Discharge: 2015-08-17 | Disposition: A | Discharge status: Home / Self Care | Payer: No Typology Code available for payment source | Source: Ambulatory Visit | Attending: Surgery | Admitting: Surgery

## 2015-08-17 DIAGNOSIS — K432 Incisional hernia without obstruction or gangrene: Secondary | ICD-10-CM | POA: Insufficient documentation

## 2015-08-17 DIAGNOSIS — Z01818 Encounter for other preprocedural examination: Secondary | ICD-10-CM | POA: Insufficient documentation

## 2015-08-17 DIAGNOSIS — Z01812 Encounter for preprocedural laboratory examination: Secondary | ICD-10-CM | POA: Insufficient documentation

## 2015-08-17 DIAGNOSIS — R9431 Abnormal electrocardiogram [ECG] [EKG]: Secondary | ICD-10-CM | POA: Diagnosis not present

## 2015-08-17 LAB — CBC WITH DIFFERENTIAL/PLATELET
BASOS ABS: 0 10*3/uL (ref 0.0–0.1)
Basophils Relative: 0 %
EOS PCT: 2 %
Eosinophils Absolute: 0.2 10*3/uL (ref 0.0–0.7)
HEMATOCRIT: 41.7 % (ref 36.0–46.0)
HEMOGLOBIN: 14.5 g/dL (ref 12.0–15.0)
LYMPHS ABS: 2.6 10*3/uL (ref 0.7–4.0)
LYMPHS PCT: 30 %
MCH: 30.1 pg (ref 26.0–34.0)
MCHC: 34.8 g/dL (ref 30.0–36.0)
MCV: 86.5 fL (ref 78.0–100.0)
Monocytes Absolute: 0.5 10*3/uL (ref 0.1–1.0)
Monocytes Relative: 6 %
NEUTROS ABS: 5.2 10*3/uL (ref 1.7–7.7)
NEUTROS PCT: 62 %
Platelets: 283 10*3/uL (ref 150–400)
RBC: 4.82 MIL/uL (ref 3.87–5.11)
RDW: 13.3 % (ref 11.5–15.5)
WBC: 8.5 10*3/uL (ref 4.0–10.5)

## 2015-08-17 LAB — COMPREHENSIVE METABOLIC PANEL
ALK PHOS: 74 U/L (ref 38–126)
ALT: 70 U/L — AB (ref 14–54)
AST: 43 U/L — AB (ref 15–41)
Albumin: 4.1 g/dL (ref 3.5–5.0)
Anion gap: 16 — ABNORMAL HIGH (ref 5–15)
BILIRUBIN TOTAL: 0.5 mg/dL (ref 0.3–1.2)
BUN: 16 mg/dL (ref 6–20)
CALCIUM: 10.2 mg/dL (ref 8.9–10.3)
CO2: 22 mmol/L (ref 22–32)
CREATININE: 0.6 mg/dL (ref 0.44–1.00)
Chloride: 101 mmol/L (ref 101–111)
Glucose, Bld: 137 mg/dL — ABNORMAL HIGH (ref 65–99)
Potassium: 4.3 mmol/L (ref 3.5–5.1)
Sodium: 139 mmol/L (ref 135–145)
Total Protein: 7.6 g/dL (ref 6.5–8.1)

## 2015-08-17 LAB — SURGICAL PCR SCREEN
MRSA, PCR: NEGATIVE
Staphylococcus aureus: NEGATIVE

## 2015-08-17 LAB — HM MAMMOGRAPHY

## 2015-08-17 LAB — GLUCOSE, CAPILLARY: GLUCOSE-CAPILLARY: 128 mg/dL — AB (ref 65–99)

## 2015-08-17 NOTE — Pre-Procedure Instructions (Signed)
Catherine Castillo  08/17/2015      [id number] - [place], [place] [address] [place] [address] Phone: [phone] Fax: [place], [place] [address] [place] [address] Phone: [phone] Fax: [phone]    Your procedure is scheduled on Nov 10.  Report to [facility] Admitting at 900 A.M.  Call this number if you have problems the morning of surgery:  [phone]   Remember:  Do not eat food or drink liquids after midnight.  Take these medicines the morning of surgery with A SIP OF WATER Levothyroxine (Synthroid)   Stop taking Aspirin, Ibuprofen, BC's, Goody's, Aleve, Herbal medications, Fish oil How to Manage Your Diabetes Before Surgery   Why is it important to control my blood sugar before and after surgery?   Improving blood sugar levels before and after surgery helps healing and can limit problems.  A way of improving blood sugar control is eating a healthy diet by:  - Eating less sugar and carbohydrates  - Increasing activity/exercise  - Talk with your doctor about reaching your blood sugar goals  High blood sugars (greater than 180 mg/dL) can raise your risk of infections and slow down your recovery so you will need to focus on controlling your diabetes during the weeks before surgery.  Make sure that the doctor who takes care of your diabetes knows about your planned surgery including the date and location.  How do I manage my blood sugars before surgery?   Check your blood sugar at least 4 times a day, 2 days before surgery to make sure that they are not too high or low.   Check your blood sugar the morning of your surgery when you wake up and every 2               hours until you get to the Short-Stay unit.  If your blood sugar is less than 70 mg/dL, you will need to treat for low  blood sugar by:  Treat a low blood sugar (less than 70 mg/dL) with 1/2 cup of clear juice (cranberry or apple), 4 glucose tablets, OR glucose gel.  Recheck blood sugar in 15 minutes after treatment (to make sure it is greater than 70 mg/dL).  If blood sugar is not greater than 70 mg/dL on re-check, call [phone] for further instructions.   Report your blood sugar to the Short-Stay nurse when you get to Short-Stay.  References:  [facility], 2007 "How to Manage your Diabetes Before and After Surgery".  What do I do about my diabetes medications?   Do not take oral diabetes medicines (pills) the morning of surgery. Janumet    If your CBG is greater than 220 mg/dL, you may take 1/2 of your sliding scale (correction) dose of insulin.   Do not wear jewelry, make-up or nail polish.  Do not wear lotions, powders, or perfumes.  You may wear deodorant.  Do not shave 48 hours prior to surgery.  Men may shave face and neck.  Do not bring valuables to the hospital.  [facility] is not responsible for any belongings or valuables.  Contacts, dentures or bridgework may not be worn into surgery.  Leave your suitcase in the car.  After surgery it may be brought to your room.  For patients admitted  to the hospital, discharge time will be determined by your treatment team.  Patients discharged the day of surgery will not be allowed to drive home.    Special instructions:  [place] - Preparing for Surgery  Before surgery, you can play an important role.  Because skin is not sterile, your skin needs to be as free of germs as possible.  You can reduce the number of germs on you skin by washing with CHG (chlorahexidine gluconate) soap before surgery.  CHG is an antiseptic cleaner which kills germs and bonds with the skin to continue killing germs even after washing.  Please DO NOT use if you have an allergy to CHG or antibacterial soaps.  If your skin becomes  reddened/irritated stop using the CHG and inform your nurse when you arrive at Short Stay.  Do not shave (including legs and underarms) for at least 48 hours prior to the first CHG shower.  You may shave your face.  Please follow these instructions carefully:   1.  Shower with CHG Soap the night before surgery and the   morning of Surgery.  2.  If you choose to wash your hair, wash your hair first as usual with your normal shampoo.  3.  After you shampoo, rinse your hair and body thoroughly to remove the  Shampoo.  4.  Use CHG as you would any other liquid soap.  You can apply chg directly  to the skin and wash gently with scrungie or a clean washcloth.  5.  Apply the CHG Soap to your body ONLY FROM THE NECK DOWN.    Do not use on open wounds or open sores.  Avoid contact with your eyes,  ears, mouth and genitals (private parts).  Wash genitals (private parts)   with your normal soap.  6.  Wash thoroughly, paying special attention to the area where your surgery  will be performed.  7.  Thoroughly rinse your body with warm water from the neck down.  8.  DO NOT shower/wash with your normal soap after using and rinsing off the CHG Soap.  9.  Pat yourself dry with a clean towel.            10.  Wear clean pajamas.            11.  Place clean sheets on your bed the night of your first shower and do not  sleep with pets.  Day of Surgery  Do not apply any lotions/deoderants the morning of surgery.  Please wear clean clothes to the hospital/surgery center.     Please read over the following fact sheets that you were given. Pain Booklet, Coughing and Deep Breathing, MRSA Information and Surgical Site Infection Prevention

## 2015-08-17 NOTE — Progress Notes (Signed)
PCP is Dr. Annye Asa Denies seeing a cardiologist. Denies ever having a card cath or echo. States she had a stress test in 2008 Reports her fasting CBG's run around 120 today was 128.

## 2015-08-18 LAB — HEMOGLOBIN A1C
Hgb A1c MFr Bld: 7.7 % — ABNORMAL HIGH (ref 4.8–5.6)
MEAN PLASMA GLUCOSE: 174 mg/dL

## 2015-08-20 ENCOUNTER — Encounter: Payer: Self-pay | Admitting: General Practice

## 2015-08-24 ENCOUNTER — Encounter: Payer: Self-pay | Admitting: Family Medicine

## 2015-08-26 MED ORDER — CLINDAMYCIN PHOSPHATE 300 MG/50ML IV SOLN
300.0000 mg | INTRAVENOUS | Status: AC
  Administered 2015-08-27: 300 mg via INTRAVENOUS
  Filled 2015-08-26: qty 50

## 2015-08-26 MED ORDER — CHLORHEXIDINE GLUCONATE 4 % EX LIQD: 1.0000 "application " | Freq: Once | CUTANEOUS | Status: DC

## 2015-08-27 ENCOUNTER — Inpatient Hospital Stay (HOSPITAL_COMMUNITY)
Admission: AD | Admit: 2015-08-27 | Discharge: 2015-09-02 | DRG: 331 | Disposition: A | Discharge status: Home / Self Care | Payer: No Typology Code available for payment source | Source: Ambulatory Visit | Attending: Surgery | Admitting: Surgery

## 2015-08-27 ENCOUNTER — Inpatient Hospital Stay (HOSPITAL_COMMUNITY): Payer: No Typology Code available for payment source | Admitting: Certified Registered Nurse Anesthetist

## 2015-08-27 ENCOUNTER — Inpatient Hospital Stay (HOSPITAL_COMMUNITY): Payer: No Typology Code available for payment source | Admitting: Emergency Medicine

## 2015-08-27 ENCOUNTER — Encounter (HOSPITAL_COMMUNITY)
Admission: AD | Disposition: A | Discharge status: Home / Self Care | Payer: Self-pay | Source: Ambulatory Visit | Attending: Surgery

## 2015-08-27 ENCOUNTER — Encounter (HOSPITAL_COMMUNITY): Payer: Self-pay | Admitting: Certified Registered Nurse Anesthetist

## 2015-08-27 DIAGNOSIS — I10 Essential (primary) hypertension: Secondary | ICD-10-CM | POA: Diagnosis present

## 2015-08-27 DIAGNOSIS — Z833 Family history of diabetes mellitus: Secondary | ICD-10-CM

## 2015-08-27 DIAGNOSIS — E119 Type 2 diabetes mellitus without complications: Secondary | ICD-10-CM | POA: Diagnosis present

## 2015-08-27 DIAGNOSIS — D72829 Elevated white blood cell count, unspecified: Secondary | ICD-10-CM | POA: Diagnosis not present

## 2015-08-27 DIAGNOSIS — Z79899 Other long term (current) drug therapy: Secondary | ICD-10-CM | POA: Diagnosis not present

## 2015-08-27 DIAGNOSIS — K430 Incisional hernia with obstruction, without gangrene: Secondary | ICD-10-CM | POA: Diagnosis present

## 2015-08-27 DIAGNOSIS — Z8249 Family history of ischemic heart disease and other diseases of the circulatory system: Secondary | ICD-10-CM

## 2015-08-27 DIAGNOSIS — Z7984 Long term (current) use of oral hypoglycemic drugs: Secondary | ICD-10-CM

## 2015-08-27 DIAGNOSIS — Z880 Allergy status to penicillin: Secondary | ICD-10-CM | POA: Diagnosis not present

## 2015-08-27 DIAGNOSIS — E7800 Pure hypercholesterolemia, unspecified: Secondary | ICD-10-CM | POA: Diagnosis present

## 2015-08-27 DIAGNOSIS — Z6836 Body mass index (BMI) 36.0-36.9, adult: Secondary | ICD-10-CM

## 2015-08-27 HISTORY — DX: Type 2 diabetes mellitus without complications: E11.9

## 2015-08-27 HISTORY — DX: Malignant neoplasm of unspecified site of left female breast: C50.912

## 2015-08-27 HISTORY — PX: SMALL INTESTINE SURGERY: SHX150

## 2015-08-27 HISTORY — PX: INCISIONAL HERNIA REPAIR: SHX193

## 2015-08-27 HISTORY — PX: BOWEL RESECTION: SHX1257

## 2015-08-27 HISTORY — DX: Thyrotoxicosis with diffuse goiter without thyrotoxic crisis or storm: E05.00

## 2015-08-27 LAB — CREATININE, SERUM
CREATININE: 0.54 mg/dL (ref 0.44–1.00)
GFR calc Af Amer: >60 mL/min (ref >60)
GFR calc non Af Amer: >60 mL/min (ref >60)

## 2015-08-27 LAB — GLUCOSE, CAPILLARY
GLUCOSE-CAPILLARY: 147 mg/dL — AB (ref 65–99)
GLUCOSE-CAPILLARY: 188 mg/dL — AB (ref 65–99)
GLUCOSE-CAPILLARY: 198 mg/dL — AB (ref 65–99)
Glucose-Capillary: 145 mg/dL — ABNORMAL HIGH (ref 65–99)
Glucose-Capillary: 135 mg/dL — ABNORMAL HIGH (ref 65–99)

## 2015-08-27 LAB — CBC
HCT: 41.8 % (ref 36.0–46.0)
Hemoglobin: 13.6 g/dL (ref 12.0–15.0)
MCH: 28.6 pg (ref 26.0–34.0)
MCHC: 32.5 g/dL (ref 30.0–36.0)
MCV: 87.8 fL (ref 78.0–100.0)
PLATELETS: 267 10*3/uL (ref 150–400)
RBC: 4.76 MIL/uL (ref 3.87–5.11)
RDW: 13.1 % (ref 11.5–15.5)
WBC: 12.9 10*3/uL — ABNORMAL HIGH (ref 4.0–10.5)

## 2015-08-27 SURGERY — REPAIR, HERNIA, INCISIONAL, LAPAROSCOPIC
Anesthesia: General | Site: Abdomen

## 2015-08-27 MED ORDER — OXYCODONE HCL 5 MG PO TABS: 5.0000 mg | ORAL_TABLET | Freq: Once | ORAL | Status: DC | PRN

## 2015-08-27 MED ORDER — PHENYLEPHRINE HCL 10 MG/ML IJ SOLN
INTRAMUSCULAR | Status: DC | PRN
  Administered 2015-08-27 (×2): 40 ug via INTRAVENOUS

## 2015-08-27 MED ORDER — ACETAMINOPHEN 650 MG RE SUPP
650.0000 mg | Freq: Four times a day (QID) | RECTAL | Status: DC | PRN
  Administered 2015-08-28: 650 mg via RECTAL
  Filled 2015-08-27: qty 1

## 2015-08-27 MED ORDER — LACTATED RINGERS IV SOLN
INTRAVENOUS | Status: DC | PRN
  Administered 2015-08-27 (×2): via INTRAVENOUS

## 2015-08-27 MED ORDER — LIDOCAINE HCL (CARDIAC) 20 MG/ML IV SOLN
INTRAVENOUS | Status: DC | PRN
  Administered 2015-08-27: 60 mg via INTRAVENOUS

## 2015-08-27 MED ORDER — ONDANSETRON HCL 4 MG/2ML IJ SOLN
INTRAMUSCULAR | Status: DC | PRN
  Administered 2015-08-27: 4 mg via INTRAVENOUS

## 2015-08-27 MED ORDER — SODIUM CHLORIDE 0.9 % IJ SOLN: 9.0000 mL | INTRAMUSCULAR | Status: DC | PRN

## 2015-08-27 MED ORDER — HYDROMORPHONE 1 MG/ML IV SOLN
INTRAVENOUS | Status: DC
  Administered 2015-08-27: 14:00:00 via INTRAVENOUS
  Administered 2015-08-27: 1.8 mg via INTRAVENOUS
  Administered 2015-08-28: 1.2 mg via INTRAVENOUS
  Administered 2015-08-28: 0.9 mg via INTRAVENOUS
  Administered 2015-08-28: 1.8 mg via INTRAVENOUS
  Administered 2015-08-28: 0.6 mg via INTRAVENOUS
  Administered 2015-08-28 – 2015-08-29 (×3): 1.8 mg via INTRAVENOUS
  Administered 2015-08-29: 1.2 mg via INTRAVENOUS
  Administered 2015-08-30: 0.9 mg via INTRAVENOUS
  Administered 2015-08-30: 0.3 mg via INTRAVENOUS
  Administered 2015-08-30: 06:00:00 via INTRAVENOUS
  Filled 2015-08-27: qty 25

## 2015-08-27 MED ORDER — FENTANYL CITRATE (PF) 100 MCG/2ML IJ SOLN
INTRAMUSCULAR | Status: AC
  Administered 2015-08-27: 16:00:00
  Filled 2015-08-27: qty 2

## 2015-08-27 MED ORDER — ONDANSETRON HCL 4 MG/2ML IJ SOLN
4.0000 mg | Freq: Four times a day (QID) | INTRAMUSCULAR | Status: DC | PRN
  Administered 2015-08-29 – 2015-09-01 (×2): 4 mg via INTRAVENOUS
  Filled 2015-08-27 (×2): qty 2

## 2015-08-27 MED ORDER — ENOXAPARIN SODIUM 40 MG/0.4ML ~~LOC~~ SOLN
40.0000 mg | SUBCUTANEOUS | Status: DC
  Administered 2015-08-28 – 2015-09-01 (×5): 40 mg via SUBCUTANEOUS
  Filled 2015-08-27 (×6): qty 0.4

## 2015-08-27 MED ORDER — PROPOFOL 10 MG/ML IV BOLUS
INTRAVENOUS | Status: DC | PRN
Start: 2015-08-27 — End: 2015-08-27
  Administered 2015-08-27: 160 mg via INTRAVENOUS

## 2015-08-27 MED ORDER — DEXAMETHASONE SODIUM PHOSPHATE 4 MG/ML IJ SOLN
INTRAMUSCULAR | Status: DC | PRN
  Administered 2015-08-27: 4 mg via INTRAVENOUS

## 2015-08-27 MED ORDER — METHOCARBAMOL 500 MG PO TABS: 500.0000 mg | ORAL_TABLET | Freq: Four times a day (QID) | ORAL | Status: DC | PRN

## 2015-08-27 MED ORDER — FENTANYL CITRATE (PF) 100 MCG/2ML IJ SOLN
INTRAMUSCULAR | Status: AC
  Administered 2015-08-27: 50 ug via INTRAVENOUS
  Filled 2015-08-27: qty 2

## 2015-08-27 MED ORDER — NALOXONE HCL 0.4 MG/ML IJ SOLN
0.4000 mg | INTRAMUSCULAR | Status: DC | PRN
Start: 2015-08-27 — End: 2015-08-30

## 2015-08-27 MED ORDER — HYDROMORPHONE 1 MG/ML IV SOLN
INTRAVENOUS | Status: AC
Start: 2015-08-27 — End: 2015-08-28
  Filled 2015-08-27: qty 25

## 2015-08-27 MED ORDER — PHENYLEPHRINE 40 MCG/ML (10ML) SYRINGE FOR IV PUSH (FOR BLOOD PRESSURE SUPPORT)
PREFILLED_SYRINGE | INTRAVENOUS | Status: AC
  Filled 2015-08-27: qty 10

## 2015-08-27 MED ORDER — ONDANSETRON 4 MG PO TBDP
4.0000 mg | ORAL_TABLET | Freq: Four times a day (QID) | ORAL | Status: DC | PRN
  Administered 2015-08-31: 4 mg via ORAL
  Filled 2015-08-27: qty 1

## 2015-08-27 MED ORDER — MIDAZOLAM HCL 2 MG/2ML IJ SOLN
INTRAMUSCULAR | Status: AC
  Filled 2015-08-27: qty 4

## 2015-08-27 MED ORDER — ROCURONIUM BROMIDE 50 MG/5ML IV SOLN
INTRAVENOUS | Status: AC
  Filled 2015-08-27: qty 1

## 2015-08-27 MED ORDER — SIMETHICONE 80 MG PO CHEW: 40.0000 mg | CHEWABLE_TABLET | Freq: Four times a day (QID) | ORAL | Status: DC | PRN

## 2015-08-27 MED ORDER — FENTANYL CITRATE (PF) 250 MCG/5ML IJ SOLN
INTRAMUSCULAR | Status: AC
  Filled 2015-08-27: qty 5

## 2015-08-27 MED ORDER — ROCURONIUM BROMIDE 100 MG/10ML IV SOLN
INTRAVENOUS | Status: DC | PRN
  Administered 2015-08-27: 50 mg via INTRAVENOUS

## 2015-08-27 MED ORDER — DIPHENHYDRAMINE HCL 12.5 MG/5ML PO ELIX: 12.5000 mg | ORAL_SOLUTION | Freq: Four times a day (QID) | ORAL | Status: DC | PRN

## 2015-08-27 MED ORDER — DIPHENHYDRAMINE HCL 50 MG/ML IJ SOLN: 12.5000 mg | Freq: Four times a day (QID) | INTRAMUSCULAR | Status: DC | PRN

## 2015-08-27 MED ORDER — ACETAMINOPHEN 325 MG PO TABS
650.0000 mg | ORAL_TABLET | Freq: Four times a day (QID) | ORAL | Status: DC | PRN
Start: 2015-08-27 — End: 2015-09-02
  Filled 2015-08-27: qty 2

## 2015-08-27 MED ORDER — LISINOPRIL 20 MG PO TABS
20.0000 mg | ORAL_TABLET | Freq: Every day | ORAL | Status: DC
  Administered 2015-08-27 – 2015-09-02 (×5): 20 mg via ORAL
  Filled 2015-08-27 (×7): qty 1

## 2015-08-27 MED ORDER — POLYETHYLENE GLYCOL 3350 17 G PO PACK
17.0000 g | PACK | Freq: Every day | ORAL | Status: DC | PRN
  Administered 2015-08-31: 17 g via ORAL
  Filled 2015-08-27: qty 1

## 2015-08-27 MED ORDER — VECURONIUM BROMIDE 10 MG IV SOLR
INTRAVENOUS | Status: AC
  Filled 2015-08-27: qty 10

## 2015-08-27 MED ORDER — FENTANYL CITRATE (PF) 100 MCG/2ML IJ SOLN
INTRAMUSCULAR | Status: DC | PRN
  Administered 2015-08-27 (×2): 50 ug via INTRAVENOUS
  Administered 2015-08-27: 100 ug via INTRAVENOUS
  Administered 2015-08-27 (×4): 50 ug via INTRAVENOUS

## 2015-08-27 MED ORDER — 0.9 % SODIUM CHLORIDE (POUR BTL) OPTIME
TOPICAL | Status: DC | PRN
  Administered 2015-08-27 (×2): 1000 mL

## 2015-08-27 MED ORDER — LIDOCAINE HCL (CARDIAC) 20 MG/ML IV SOLN
INTRAVENOUS | Status: AC
  Filled 2015-08-27: qty 5

## 2015-08-27 MED ORDER — VECURONIUM BROMIDE 10 MG IV SOLR
INTRAVENOUS | Status: DC | PRN
  Administered 2015-08-27 (×4): 1 mg via INTRAVENOUS

## 2015-08-27 MED ORDER — ONDANSETRON HCL 4 MG/2ML IJ SOLN: 4.0000 mg | Freq: Four times a day (QID) | INTRAMUSCULAR | Status: DC | PRN

## 2015-08-27 MED ORDER — ATORVASTATIN CALCIUM 10 MG PO TABS
10.0000 mg | ORAL_TABLET | Freq: Every day | ORAL | Status: DC
  Administered 2015-08-28 – 2015-09-02 (×4): 10 mg via ORAL
  Filled 2015-08-27 (×6): qty 1

## 2015-08-27 MED ORDER — OXYCODONE HCL 5 MG/5ML PO SOLN: 5.0000 mg | Freq: Once | ORAL | Status: DC | PRN

## 2015-08-27 MED ORDER — INSULIN ASPART 100 UNIT/ML ~~LOC~~ SOLN
0.0000 [IU] | SUBCUTANEOUS | Status: DC
  Administered 2015-08-27 (×2): 3 [IU] via SUBCUTANEOUS
  Administered 2015-08-28: 2 [IU] via SUBCUTANEOUS
  Administered 2015-08-28 (×4): 3 [IU] via SUBCUTANEOUS
  Administered 2015-08-29 (×5): 2 [IU] via SUBCUTANEOUS
  Administered 2015-08-29: 3 [IU] via SUBCUTANEOUS
  Administered 2015-08-30: 2 [IU] via SUBCUTANEOUS
  Administered 2015-08-30: 3 [IU] via SUBCUTANEOUS
  Administered 2015-08-30 (×5): 2 [IU] via SUBCUTANEOUS
  Administered 2015-08-31 (×3): 3 [IU] via SUBCUTANEOUS
  Administered 2015-08-31: 2 [IU] via SUBCUTANEOUS
  Administered 2015-08-31: 3 [IU] via SUBCUTANEOUS
  Administered 2015-09-01: 2 [IU] via SUBCUTANEOUS
  Administered 2015-09-01 (×2): 3 [IU] via SUBCUTANEOUS
  Administered 2015-09-01 (×3): 2 [IU] via SUBCUTANEOUS
  Administered 2015-09-02: 3 [IU] via SUBCUTANEOUS
  Administered 2015-09-02 (×2): 2 [IU] via SUBCUTANEOUS

## 2015-08-27 MED ORDER — ONDANSETRON HCL 4 MG/2ML IJ SOLN
INTRAMUSCULAR | Status: AC
  Filled 2015-08-27: qty 2

## 2015-08-27 MED ORDER — KETOROLAC TROMETHAMINE 30 MG/ML IJ SOLN
30.0000 mg | Freq: Four times a day (QID) | INTRAMUSCULAR | Status: AC
  Administered 2015-08-27 – 2015-09-01 (×20): 30 mg via INTRAVENOUS
  Filled 2015-08-27 (×20): qty 1

## 2015-08-27 MED ORDER — SUGAMMADEX SODIUM 200 MG/2ML IV SOLN
INTRAVENOUS | Status: AC
  Filled 2015-08-27: qty 2

## 2015-08-27 MED ORDER — LEVOTHYROXINE SODIUM 50 MCG PO TABS
150.0000 ug | ORAL_TABLET | Freq: Every day | ORAL | Status: DC
  Administered 2015-08-28 – 2015-09-02 (×4): 150 ug via ORAL
  Filled 2015-08-27 (×11): qty 1

## 2015-08-27 MED ORDER — BISACODYL 10 MG RE SUPP: 10.0000 mg | Freq: Every day | RECTAL | Status: DC | PRN

## 2015-08-27 MED ORDER — MIDAZOLAM HCL 5 MG/5ML IJ SOLN
INTRAMUSCULAR | Status: DC | PRN
  Administered 2015-08-27: 2 mg via INTRAVENOUS

## 2015-08-27 MED ORDER — HYDRALAZINE HCL 20 MG/ML IJ SOLN: 10.0000 mg | INTRAMUSCULAR | Status: DC | PRN

## 2015-08-27 MED ORDER — ZOLPIDEM TARTRATE 5 MG PO TABS: 5.0000 mg | ORAL_TABLET | Freq: Every evening | ORAL | Status: DC | PRN

## 2015-08-27 MED ORDER — LACTATED RINGERS IV SOLN
INTRAVENOUS | Status: DC
  Administered 2015-08-27: 10:00:00 via INTRAVENOUS

## 2015-08-27 MED ORDER — SUGAMMADEX SODIUM 200 MG/2ML IV SOLN
INTRAVENOUS | Status: DC | PRN
  Administered 2015-08-27: 200 mg via INTRAVENOUS

## 2015-08-27 MED ORDER — OXYCODONE HCL 5 MG PO TABS
5.0000 mg | ORAL_TABLET | ORAL | Status: DC | PRN
  Administered 2015-08-31 (×2): 5 mg via ORAL
  Administered 2015-09-01 – 2015-09-02 (×2): 10 mg via ORAL
  Filled 2015-08-27: qty 2
  Filled 2015-08-27 (×2): qty 1
  Filled 2015-08-27: qty 2

## 2015-08-27 MED ORDER — CIPROFLOXACIN IN D5W 400 MG/200ML IV SOLN
400.0000 mg | Freq: Two times a day (BID) | INTRAVENOUS | Status: AC
  Administered 2015-08-27: 400 mg via INTRAVENOUS
  Filled 2015-08-27: qty 200

## 2015-08-27 MED ORDER — KCL IN DEXTROSE-NACL 20-5-0.9 MEQ/L-%-% IV SOLN
INTRAVENOUS | Status: DC
Start: 2015-08-27 — End: 2015-09-02
  Administered 2015-08-27 – 2015-08-28 (×2): via INTRAVENOUS
  Administered 2015-08-28 – 2015-08-29 (×2): 1 mL via INTRAVENOUS
  Administered 2015-08-29 – 2015-09-01 (×7): via INTRAVENOUS
  Filled 2015-08-27 (×22): qty 1000

## 2015-08-27 MED ORDER — FENTANYL CITRATE (PF) 100 MCG/2ML IJ SOLN
25.0000 ug | INTRAMUSCULAR | Status: DC | PRN
  Administered 2015-08-27 (×3): 50 ug via INTRAVENOUS

## 2015-08-27 MED ORDER — DEXAMETHASONE SODIUM PHOSPHATE 4 MG/ML IJ SOLN
INTRAMUSCULAR | Status: AC
  Filled 2015-08-27: qty 1

## 2015-08-27 MED ORDER — PANTOPRAZOLE SODIUM 40 MG IV SOLR
40.0000 mg | Freq: Every day | INTRAVENOUS | Status: DC
Start: 2015-08-27 — End: 2015-09-02
  Administered 2015-08-27 – 2015-09-01 (×6): 40 mg via INTRAVENOUS
  Filled 2015-08-27 (×6): qty 40

## 2015-08-27 MED ORDER — KETOROLAC TROMETHAMINE 30 MG/ML IJ SOLN: 30.0000 mg | Freq: Four times a day (QID) | INTRAMUSCULAR | Status: DC | PRN

## 2015-08-27 MED ORDER — ONDANSETRON HCL 4 MG/2ML IJ SOLN: 4.0000 mg | Freq: Once | INTRAMUSCULAR | Status: DC | PRN

## 2015-08-27 SURGICAL SUPPLY — 77 items
APPLIER CLIP ROT 10 11.4 M/L (STAPLE)
APR CLP MED LRG 11.4X10 (STAPLE)
BINDER ABD UNIV 12 45-62 (WOUND CARE) IMPLANT
BINDER ABDOMINAL 46IN 62IN (WOUND CARE)
BLADE SURG 10 STRL SS (BLADE) ×2 IMPLANT
BLADE SURG ROTATE 9660 (MISCELLANEOUS) IMPLANT
CANISTER SUCTION 2500CC (MISCELLANEOUS) ×3 IMPLANT
CHLORAPREP W/TINT 26ML (MISCELLANEOUS) ×3 IMPLANT
CLIP APPLIE ROT 10 11.4 M/L (STAPLE) IMPLANT
COVER SURGICAL LIGHT HANDLE (MISCELLANEOUS) ×3 IMPLANT
DEVICE RELIATACK FIXATION (MISCELLANEOUS) ×3 IMPLANT
DEVICE TROCAR PUNCTURE CLOSURE (ENDOMECHANICALS) ×3 IMPLANT
DRAPE WARM FLUID 44X44 (DRAPE) ×3 IMPLANT
DRSG OPSITE POSTOP 4X10 (GAUZE/BANDAGES/DRESSINGS) ×2 IMPLANT
ELECT CAUTERY BLADE 6.4 (BLADE) ×3 IMPLANT
ELECT REM PT RETURN 9FT ADLT (ELECTROSURGICAL) ×3
ELECTRODE REM PT RTRN 9FT ADLT (ELECTROSURGICAL) ×2 IMPLANT
GAUZE SPONGE 4X4 12PLY STRL (GAUZE/BANDAGES/DRESSINGS) ×2 IMPLANT
GLOVE BIO SURGEON STRL SZ8 (GLOVE) ×3 IMPLANT
GLOVE BIO SURGEON STRL SZ8.5 (GLOVE) ×6 IMPLANT
GLOVE BIOGEL PI IND STRL 6.5 (GLOVE) ×1 IMPLANT
GLOVE BIOGEL PI IND STRL 7.0 (GLOVE) ×2 IMPLANT
GLOVE BIOGEL PI IND STRL 7.5 (GLOVE) ×2 IMPLANT
GLOVE BIOGEL PI IND STRL 8 (GLOVE) ×2 IMPLANT
GLOVE BIOGEL PI IND STRL 8.5 (GLOVE) ×2 IMPLANT
GLOVE BIOGEL PI INDICATOR 6.5 (GLOVE) ×1
GLOVE BIOGEL PI INDICATOR 7.0 (GLOVE) ×2
GLOVE BIOGEL PI INDICATOR 7.5 (GLOVE) ×2
GLOVE BIOGEL PI INDICATOR 8 (GLOVE) ×1
GLOVE BIOGEL PI INDICATOR 8.5 (GLOVE) ×2
GLOVE ECLIPSE 6.5 STRL STRAW (GLOVE) ×3 IMPLANT
GLOVE ECLIPSE 7.5 STRL STRAW (GLOVE) ×4 IMPLANT
GLOVE SURG SS PI 6.5 STRL IVOR (GLOVE) ×2 IMPLANT
GOWN STRL REUS W/ TWL LRG LVL3 (GOWN DISPOSABLE) ×4 IMPLANT
GOWN STRL REUS W/ TWL XL LVL3 (GOWN DISPOSABLE) ×2 IMPLANT
GOWN STRL REUS W/TWL LRG LVL3 (GOWN DISPOSABLE) ×6
GOWN STRL REUS W/TWL XL LVL3 (GOWN DISPOSABLE) ×3
KIT BASIN OR (CUSTOM PROCEDURE TRAY) ×3 IMPLANT
KIT ROOM TURNOVER OR (KITS) ×3 IMPLANT
LIGASURE IMPACT 36 18CM CVD LR (INSTRUMENTS) ×2 IMPLANT
LIQUID BAND (GAUZE/BANDAGES/DRESSINGS) ×1 IMPLANT
MARKER SKIN DUAL TIP RULER LAB (MISCELLANEOUS) ×3 IMPLANT
NS IRRIG 1000ML POUR BTL (IV SOLUTION) ×5 IMPLANT
PAD ARMBOARD 7.5X6 YLW CONV (MISCELLANEOUS) ×6 IMPLANT
PENCIL BUTTON HOLSTER BLD 10FT (ELECTRODE) ×3 IMPLANT
RELOAD ENDO RELIATCK 10 HERNIA (MISCELLANEOUS) IMPLANT
RELOAD ENDO RELIATCK 5 HERNIA (MISCELLANEOUS) IMPLANT
RELOAD PROXIMATE 75MM BLUE (ENDOMECHANICALS) ×6 IMPLANT
RELOAD RELIATACK 10 (MISCELLANEOUS) IMPLANT
RELOAD RELIATACK 5 (MISCELLANEOUS) IMPLANT
RELOAD STAPLE 75 3.8 BLU REG (ENDOMECHANICALS) IMPLANT
SCALPEL HARMONIC ACE (MISCELLANEOUS) IMPLANT
SCISSORS LAP 5X35 DISP (ENDOMECHANICALS) ×3 IMPLANT
SET IRRIG TUBING LAPAROSCOPIC (IRRIGATION / IRRIGATOR) IMPLANT
SLEEVE ENDOPATH XCEL 5M (ENDOMECHANICALS) ×7 IMPLANT
SPECIMEN JAR LARGE (MISCELLANEOUS) ×2 IMPLANT
SPONGE LAP 18X18 X RAY DECT (DISPOSABLE) ×2 IMPLANT
STAPLER GUN LINEAR PROX 60 (STAPLE) ×2 IMPLANT
STAPLER PROXIMATE 75MM BLUE (STAPLE) ×2 IMPLANT
STAPLER VISISTAT 35W (STAPLE) ×2 IMPLANT
SUCTION POOLE TIP (SUCTIONS) ×2 IMPLANT
SUT MON AB 4-0 PC3 18 (SUTURE) ×3 IMPLANT
SUT NOVA NAB DX-16 0-1 5-0 T12 (SUTURE) ×5 IMPLANT
SUT PDS AB 1 TP1 96 (SUTURE) ×4 IMPLANT
SUT VIC AB 0 CT1 27 (SUTURE)
SUT VIC AB 0 CT1 27XBRD ANBCTR (SUTURE) IMPLANT
SUT VIC AB 3-0 SH 18 (SUTURE) ×8 IMPLANT
TAPE CLOTH SURG 4X10 WHT LF (GAUZE/BANDAGES/DRESSINGS) ×2 IMPLANT
TOWEL OR 17X24 6PK STRL BLUE (TOWEL DISPOSABLE) ×3 IMPLANT
TOWEL OR 17X26 10 PK STRL BLUE (TOWEL DISPOSABLE) ×3 IMPLANT
TRAY FOLEY CATH 16FR SILVER (SET/KITS/TRAYS/PACK) ×2 IMPLANT
TRAY LAPAROSCOPIC MC (CUSTOM PROCEDURE TRAY) ×3 IMPLANT
TROCAR XCEL NON-BLD 11X100MML (ENDOMECHANICALS) ×3 IMPLANT
TROCAR XCEL NON-BLD 5MMX100MML (ENDOMECHANICALS) ×3 IMPLANT
TUBE CONNECTING 12X1/4 (SUCTIONS) ×3 IMPLANT
TUBING INSUFFLATION (TUBING) ×3 IMPLANT
YANKAUER SUCT BULB TIP NO VENT (SUCTIONS) ×3 IMPLANT

## 2015-08-27 NOTE — Transfer of Care (Signed)
Immediate Anesthesia Transfer of Care Note  Patient: Catherine Castillo  Procedure(s) Performed: Procedure(s): LAPAROSCOPIC INCISIONAL HERNIA REPAIR CONVERTED TO OPEN (N/A) SMALL BOWEL RESECTION (N/A)  Patient Location: PACU  Anesthesia Type:General  Level of Consciousness: awake, alert  and oriented  Airway & Oxygen Therapy: Patient Spontanous Breathing and Patient connected to nasal cannula oxygen  Post-op Assessment: Report given to RN, Post -op Vital signs reviewed and stable and Patient moving all extremities X 4  Post vital signs: Reviewed and stable  Last Vitals:  Filed Vitals:   08/27/15 0932  BP: 168/86  Pulse: 85  Temp: 36.6 C  Resp: 20    Complications: No apparent anesthesia complications

## 2015-08-27 NOTE — Anesthesia Preprocedure Evaluation (Addendum)
Anesthesia Evaluation  Patient identified by MRN, date of birth, ID band Patient awake    Reviewed: Allergy & Precautions, NPO status , Patient's Chart, lab work & pertinent test results  Airway Mallampati: II  TM Distance: >3 FB Neck ROM: Full    Dental  (+) Dental Advisory Given   Pulmonary former smoker,    breath sounds clear to auscultation       Cardiovascular hypertension, Pt. on medications  Rhythm:Regular Rate:Normal     Neuro/Psych  Headaches,    GI/Hepatic   Endo/Other  diabetes, Oral Hypoglycemic AgentsHypothyroidism Morbid obesity  Renal/GU      Musculoskeletal  (+) Arthritis ,   Abdominal   Peds  Hematology   Anesthesia Other Findings   Reproductive/Obstetrics                            Anesthesia Physical Anesthesia Plan  ASA: III  Anesthesia Plan: General   Post-op Pain Management:    Induction: Intravenous  Airway Management Planned: Oral ETT  Additional Equipment:   Intra-op Plan:   Post-operative Plan: Extubation in OR  Informed Consent: I have reviewed the patients History and Physical, chart, labs and discussed the procedure including the risks, benefits and alternatives for the proposed anesthesia with the patient or authorized representative who has indicated his/her understanding and acceptance.   Dental advisory given  Plan Discussed with: CRNA, Anesthesiologist and Surgeon  Anesthesia Plan Comments:         Anesthesia Quick Evaluation

## 2015-08-27 NOTE — Anesthesia Postprocedure Evaluation (Signed)
  Anesthesia Post-op Note  Patient: Shelisha A Service  Procedure(s) Performed: Procedure(s): LAPAROSCOPIC INCISIONAL HERNIA REPAIR CONVERTED TO OPEN (N/A) SMALL BOWEL RESECTION (N/A)  Patient Location: PACU  Anesthesia Type:General  Level of Consciousness: awake, alert  and oriented  Airway and Oxygen Therapy: Patient Spontanous Breathing and Patient connected to nasal cannula oxygen  Post-op Pain: mild  Post-op Assessment: Post-op Vital signs reviewed, Patient's Cardiovascular Status Stable, Respiratory Function Stable and Patent Airway              Post-op Vital Signs: stable  Last Vitals:  Filed Vitals:   08/27/15 1540  BP: 162/94  Pulse: 102  Temp: 36.4 C  Resp: 14    Complications: No apparent anesthesia complications

## 2015-08-27 NOTE — H&P (Signed)
H&P   Catherine Castillo (MR# [id number])      H&P Info    Author Note Status Last Update User Last Update Date/Time   Erroll Luna, MD Signed Erroll Luna, MD 07/27/2015 3:54 PM    H&P    Expand All Collapse All   [name] 07/27/2015 9:47 AM Location: [facility] Patient #: [id number] DOB: Jul 25, 1961 Single / Language: Catherine Castillo / Race: White Female History of Present Illness [name] A. Elleni Mozingo MD; 07/27/2015 3:50 PM) Patient words: Pt presents for incisional hernia. She has eveloped a bulge at the superior portion if her laparotomy incision. She noticed the bulge when she coughs or sneezes. She had a colectomy and colostomy reversal in 2011. She is having mild to moderate pain around the hernia site. NO NAUSEA OR VOMITING. Bowels normal.  The patient is a 54 year old female   Other Problems [name], RMA; 07/27/2015 9:47 AM) Arthritis Breast Cancer Diabetes Mellitus Diverticulosis High blood pressure Hypercholesterolemia Lump In Breast Migraine Headache Thyroid Disease  Past Surgical History [name], RMA; 07/27/2015 9:47 AM) Breast Biopsy Left. Breast Mass; Local Excision Left. Colon Polyp Removal - Colonoscopy Colon Removal - Partial Knee Surgery Bilateral. Oral Surgery Sentinel Lymph Node Biopsy Tonsillectomy  Diagnostic Studies History [name], RMA; 07/27/2015 9:47 AM) Colonoscopy 1-5 years ago Mammogram within last year Pap Smear 1-5 years ago  Allergies [name], RMA; 07/27/2015 9:48 AM) Penicillin G Pot in Dextrose *PENICILLINS* Amoxil *PENICILLINS*  Medication History ([name], RMA; 07/27/2015 9:51 AM) Atorvastatin Calcium (40MG  Tablet, Oral) Active. Janumet (50-500MG  Tablet, Oral) Active. Lisinopril (20MG  Tablet, Oral) Active. Synthroid (150MCG Tablet, Oral) Active. Lipoic Acid (100MG  Capsule, Oral) Active. Vitamin C (100MG  Tablet, Oral) Active. Chromium (1MG   Capsule, Oral) Active. Fish Oil + D3 (1200-1000MG -UNIT Capsule, Oral) Active. Probiotic Acidophilus (Oral) Active. Magnesium Aspartate Dihydrate Active. Multi-Day (Oral) Active. Medications Reconciled  Social History [name], RMA; 07/27/2015 9:47 AM) Alcohol use Occasional alcohol use. Caffeine use Carbonated beverages, Coffee. No drug use Tobacco use Former smoker.  Family History [name], Tift; 07/27/2015 9:47 AM) Cancer Father. Diabetes Mellitus Father. Heart Disease Father. Hypertension Father, Sister.  Pregnancy / Birth History [name], RMA; 07/27/2015 9:47 AM) Age at menarche 20 years. Age of menopause 65-55 Gravida 0 Para 0     Review of Systems ([place]; 07/27/2015 9:47 AM) General Present- Night Sweats. Not Present- Appetite Loss, Chills, Fatigue, Fever, Weight Gain and Weight Loss. Skin Not Present- Change in Wart/Mole, Dryness, Hives, Jaundice, New Lesions, Non-Healing Wounds, Rash and Ulcer. HEENT Present- Seasonal Allergies and Wears glasses/contact lenses. Not Present- Earache, Hearing Loss, Hoarseness, Nose Bleed, Oral Ulcers, Ringing in the Ears, Sinus Pain, Sore Throat, Visual Disturbances and Yellow Eyes. Respiratory Not Present- Bloody sputum, Chronic Cough, Difficulty Breathing, Snoring and Wheezing. Breast Not Present- Breast Mass, Breast Pain, Nipple Discharge and Skin Changes. Cardiovascular Not Present- Chest Pain, Difficulty Breathing Lying Down, Leg Cramps, Palpitations, Rapid Heart Rate, Shortness of Breath and Swelling of Extremities. Gastrointestinal Not Present- Abdominal Pain, Bloating, Bloody Stool, Change in Bowel Habits, Chronic diarrhea, Constipation, Difficulty Swallowing, Excessive gas, Gets full quickly at meals, Hemorrhoids, Indigestion, Nausea, Rectal Pain and Vomiting. Female Genitourinary Not Present- Frequency, Nocturia, Painful Urination, Pelvic Pain and Urgency. Musculoskeletal Not Present- Back  Pain, Joint Pain, Joint Stiffness, Muscle Pain, Muscle Weakness and Swelling of Extremities. Neurological Not Present- Decreased Memory, Fainting, Headaches, Numbness, Seizures, Tingling, Tremor, Trouble walking and Weakness. Psychiatric Not Present- Anxiety, Bipolar, Change in Sleep Pattern, Depression,  Fearful and Frequent crying. Endocrine Not Present- Cold Intolerance, Excessive Hunger, Hair Changes, Heat Intolerance, Hot flashes and New Diabetes. Hematology Not Present- Easy Bruising, Excessive bleeding, Gland problems, HIV and Persistent Infections.  Vitals [name] RMA; 07/27/2015 9:51 AM) 07/27/2015 9:51 AM Weight: 231 lb Height: 69in Body Surface Area: 2.26 m Body Mass Index: 34.11 kg/m Temp.: 28F  Pulse: 64 (Regular)  BP: 120/80 (Sitting, Left Arm, Standard)     Physical Exam (Arleigh Dicola A. Aleksander Edmiston MD; 07/27/2015 3:52 PM)  General Mental Status-Alert. General Appearance-Consistent with stated age. Hydration-Well hydrated. Voice-Normal.  Head and Neck Head-normocephalic, atraumatic with no lesions or palpable masses. Trachea-midline. Thyroid Gland Characteristics - normal size and consistency.  Chest and Lung Exam Chest and lung exam reveals -quiet, even and easy respiratory effort with no use of accessory muscles and on auscultation, normal breath sounds, no adventitious sounds and normal vocal resonance. Inspection Chest Wall - Normal. Back - normal.  Cardiovascular Cardiovascular examination reveals -normal heart sounds, regular rate and rhythm with no murmurs and normal pedal pulses bilaterally.  Abdomen Note: midline scar noted  reducible hernia upper incision site and around old colostomy site each about 3 cm    Neurologic Neurologic evaluation reveals -alert and oriented x 3 with no impairment of recent or remote memory. Mental Status-Normal.  Musculoskeletal Normal Exam - Left-Upper Extremity Strength Normal and  Lower Extremity Strength Normal. Normal Exam - Right-Upper Extremity Strength Normal and Lower Extremity Strength Normal.    Assessment & Plan (Nicie Milan A. Anniah Glick MD; 07/27/2015 3:53 PM)  INCISIONAL HERNIA (K43.2) Impression: discussed repair and risk factors for recurrence including obesity. Discussed laparoscopic and open repair with mesh The risk of hernia repair include bleeding, infection, organ injury, bowel injury, bladder injury, nerve injury recurrent hernia, blood clots, worsening of underlying condition, chronic pain, mesh use, bowel injury bowel resection open surgery, death, and the need for other operattions. Pt agrees to proceed  Current Plans   The anatomy & physiology of the abdominal wall was discussed. The pathophysiology of hernias was discussed. Natural history risks without surgery including progeressive enlargement, pain, incarceration, & strangulation was discussed. Contributors to complications such as smoking, obesity, diabetes, prior surgery, etc were discussed.  I feel the risks of no intervention will lead to serious problems that outweigh the operative risks; therefore, I recommended surgery to reduce and repair the hernia. I explained laparoscopic techniques with possible need for an open approach. I noted the probable use of mesh to patch and/or buttress the hernia repair  Risks such as bleeding, infection, abscess, need for further treatment, heart attack, death, and other risks were discussed. I noted a good likelihood this will help address the problem. Goals of post-operative recovery were discussed as well. Possibility that this will not correct all symptoms was explained. I stressed the importance of low-impact activity, aggressive pain control, avoiding constipation, & not pushing through pain to minimize risk of post-operative chronic pain or injury. Possibility of reherniation especially with smoking, obesity, diabetes, immunosuppression, and other health  conditions was discussed. We will work to minimize complications.  An educational handout further explaining the pathology & treatment options was given as well. Questions were answered. The patient expresses understanding & wishes to proceed with surgery. Pt Education - Incisional hernia: discussed with patient and provided information. Pt Education - [facility] Laparosopic Post Op HCI (Gross) Pt Education - [facility] Hernia Post-Op HCI (Gross): discussed with patient and provided information. OBESITY, MORBID (E66.01)  MORBID OBESITY DUE TO EXCESS CALORIES (E66.01)

## 2015-08-27 NOTE — Interval H&P Note (Signed)
History and Physical Interval Note:  08/27/2015 10:50 AM  Catherine Castillo  has presented today for surgery, with the diagnosis of Incisional Hernia  The various methods of treatment have been discussed with the patient and family. After consideration of risks, benefits and other options for treatment, the patient has consented to  Procedure(s): [place] (N/A) INSERTION OF MESH (N/A) as a surgical intervention .  The patient's history has been reviewed, patient examined, no change in status, stable for surgery.  I have reviewed the patient's chart and labs.  Questions were answered to the patient's satisfaction.     Everett Ricciardelli A.

## 2015-08-27 NOTE — Op Note (Signed)
Preoperative diagnosis: Incisional hernia  Postoperative diagnosis: Incarcerated incisional hernia with omentum and small bowel  Procedure: Laparoscopic converted to open incisional hernia repair and small bowel resection  Surgeon: Erroll Luna M.D.  Anesthesia: Gen. endotracheal anesthesia  EBL: 100 mL  Specimen: Mid ileum to pathology  Drains: None  IV fluids 1.5 L crystalloid  Indications for procedure: The patient is a 54 year old female who 6 years ago underwent emergent exploratory laparotomy sigmoid colon resection with colostomy due to diverticulitis. She was then reversed 2 years later. She returns for follow-up was found to have an incisional hernia on examination. We discussed options of repair to include laparoscopic versus open. We discussed the possibility of using mesh and the possibility of other issues such as bowel resection and other surgery. We discussed the possibility of an open procedures well.The risk of hernia repair include bleeding,  Infection,   Recurrence of the hernia,  Mesh use, chronic pain,  Organ injury,  Bowel injury,  Bladder injury,   nerve injury with numbness around the incision,  Death,  and worsening of preexisting  medical problems.  The alternatives to surgery have been discussed as well..  Long term expectations of both operative and non operative treatments have been discussed.   The patient agrees to proceed.  Description of procedure: The patient was met in the holding area and questions are answered. She was taken back to the operating room and placed upon the OR table. After induction of general anesthesia, a Foley catheter is placed in both arms were tucked. The abdomen was then prepped and draped in a sterile fashion. Timeout was done and she received 300 mg of Cleocin. A 5 mm Optiview port was placed the patient's right lower quadrant. We intra-abdominal cavity under direct guidance without injury. Pneumoperitoneum was created to 15 mmHg of  CO2 the laparoscope was placed. No evidence of bowel injury with insertion of this trocar. Patient had dense intra-abdominal adhesions. I can see suture material in the midline which look like Prolene. Her second operation was not done by myself which was a colostomy closure. Small bowel appeared to be densely adherent to the Prolene and there was concern that the Prolene suture was traversing the bowel wall. I placed 2 other 5 mm ports in the right lower quadrant and then begin to bluntly dissect around this area dense adhesions to the anterior abdominal wall with a suture material was seen. I was concerned suture material is going through the outside of the bowel wall. I felt that conversion to an open procedure would be better in a situation. She had 2 small defects one in the left side of her incision and a second on the right side of her incision each were about 2-3 cm in size. No other major defects were noted except where the small bowel was adherent to the suture material which appeared also to be incarcerated in the midline. Laparoscopic instruments were passed off the field. CO2 was released. Midline incision the old scar was used and dissection was carried down to the fascia and the fascia was opened. Were able to safely adhesions down sharply until I encountered the area were the small bowel was stopped and abdominal wall. It look like a pulling sutures were through the wall the small bowel is also a small area of herniation as well which was incarcerated. I did not feel the bowel was healthy enough and felt that resection of this would be best. I dissected out the small bowel  and pulled up in into the field. GIA 75 stapling device reviewed used divide the bowel proximal and distal to this area and resected using the LigaSure as well. A side-to-side functional end anastomosis was created using the GIA 75 stapling devices and a TA 60 to close the common enterotomy. This was in the mid ileum it look like.  Mesenteric defect closed with 3-0 Vicryl. I then ran the small bowel from the cecum to the ligament of Treitz. There is no signs of any obstruction. Irrigation was used. Suctioned out. She only had 2 small defects consistent with small bowel resection and did not want to place mesh. Hemostasis was achieved. Irrigation was suctioned out. I closed the fascia with running double-stranded PDS to make sure to close the small fascial defects which I felt were secondary from the sutures since these appear to suture hernia. Subcutaneous is fat was irrigated and skin closed with staples. Honeycomb dressings placed. All final counts sponge, needle and instruments found to be correct. Patient was awoke, extubated taken to recovery in satisfactory condition.

## 2015-08-27 NOTE — Care Management Note (Signed)
Case Management Note  Patient Details  Name: Catherine Castillo MRN: [id number] Date of Birth: 11-24-1960  Subjective/Objective:                    Action/Plan:  Initial UR completed  Expected Discharge Date:                  Expected Discharge Plan:  Home/Self Care  In-House Referral:     Discharge planning Services     Post Acute Care Choice:    Choice offered to:     DME Arranged:    DME Agency:     HH Arranged:    [place] Agency:     Status of Service:  In process, will continue to follow  Medicare Important Message Given:    Date Medicare IM Given:    Medicare IM give by:    Date Additional Medicare IM Given:    Additional Medicare Important Message give by:     If discussed at [place] of Stay Meetings, dates discussed:    Additional Comments:  Marilu Favre, RN 08/27/2015, 3:46 PM

## 2015-08-27 NOTE — Anesthesia Procedure Notes (Signed)
Procedure Name: Intubation Date/Time: 08/27/2015 11:58 AM Performed by: Garrison Columbus T Pre-anesthesia Checklist: Patient identified, Emergency Drugs available, Suction available and Patient being monitored Patient Re-evaluated:Patient Re-evaluated prior to inductionOxygen Delivery Method: Circle system utilized Preoxygenation: Pre-oxygenation with 100% oxygen Intubation Type: IV induction Ventilation: Mask ventilation without difficulty and Oral airway inserted - appropriate to patient size Laryngoscope Size: Sabra Heck and 2 Grade View: Grade I Tube type: Oral Tube size: 7.5 mm Number of attempts: 1 Airway Equipment and Method: Stylet and Oral airway Placement Confirmation: ETT inserted through vocal cords under direct vision,  positive ETCO2 and breath sounds checked- equal and bilateral Secured at: 21 cm Tube secured with: Tape Dental Injury: Teeth and Oropharynx as per pre-operative assessment

## 2015-08-27 NOTE — Progress Notes (Signed)
Orthopedic Tech Progress Note Patient Details:  Catherine Castillo 08/05/1961 [id number]  Ortho Devices Type of Ortho Device: Abdominal binder Ortho Device/Splint Interventions: Application   [place] Pink 08/27/2015, 5:48 PM

## 2015-08-28 LAB — CBC
HEMATOCRIT: 38.3 % (ref 36.0–46.0)
Hemoglobin: 12.4 g/dL (ref 12.0–15.0)
MCH: 28.5 pg (ref 26.0–34.0)
MCHC: 32.4 g/dL (ref 30.0–36.0)
MCV: 88 fL (ref 78.0–100.0)
Platelets: 283 10*3/uL (ref 150–400)
RBC: 4.35 MIL/uL (ref 3.87–5.11)
RDW: 13.2 % (ref 11.5–15.5)
WBC: 12.8 10*3/uL — ABNORMAL HIGH (ref 4.0–10.5)

## 2015-08-28 LAB — COMPREHENSIVE METABOLIC PANEL
ALBUMIN: 3.1 g/dL — AB (ref 3.5–5.0)
ALT: 50 U/L (ref 14–54)
AST: 25 U/L (ref 15–41)
Alkaline Phosphatase: 55 U/L (ref 38–126)
Anion gap: 8 (ref 5–15)
BILIRUBIN TOTAL: 0.8 mg/dL (ref 0.3–1.2)
BUN: 14 mg/dL (ref 6–20)
CHLORIDE: 104 mmol/L (ref 101–111)
CO2: 27 mmol/L (ref 22–32)
Calcium: 8.6 mg/dL — ABNORMAL LOW (ref 8.9–10.3)
Creatinine, Ser: 0.63 mg/dL (ref 0.44–1.00)
GFR calc Af Amer: >60 mL/min (ref >60)
GFR calc non Af Amer: >60 mL/min (ref >60)
GLUCOSE: 177 mg/dL — AB (ref 65–99)
POTASSIUM: 4.2 mmol/L (ref 3.5–5.1)
Sodium: 139 mmol/L (ref 135–145)
Total Protein: 6.1 g/dL — ABNORMAL LOW (ref 6.5–8.1)

## 2015-08-28 LAB — GLUCOSE, CAPILLARY
GLUCOSE-CAPILLARY: 170 mg/dL — AB (ref 65–99)
GLUCOSE-CAPILLARY: 117 mg/dL — AB (ref 65–99)
Glucose-Capillary: 140 mg/dL — ABNORMAL HIGH (ref 65–99)
Glucose-Capillary: 158 mg/dL — ABNORMAL HIGH (ref 65–99)
Glucose-Capillary: 173 mg/dL — ABNORMAL HIGH (ref 65–99)
Glucose-Capillary: 178 mg/dL — ABNORMAL HIGH (ref 65–99)

## 2015-08-28 NOTE — Progress Notes (Signed)
Pt has temp 102.7 Dr. Rosendo Gros on call informed , tylenol given PR as ordered, and continue use of incentive spirometer and ambulation, will continue to monitor.

## 2015-08-28 NOTE — Progress Notes (Signed)
1 Day Post-Op  Subjective: Sore but ok   Objective: Vital signs in last 24 hours: Temp:  [97.5 F (36.4 C)-98.3 F (36.8 C)] 98.1 F (36.7 C) (11/11 0500) Pulse Rate:  [85-115] 114 (11/11 0500) Resp:  [9-24] 16 (11/11 0838) BP: (135-171)/(78-94) 135/78 mmHg (11/11 0500) SpO2:  [91 %-99 %] 99 % (11/11 0838) Weight:  [104.736 kg (230 lb 14.4 oz)-113.399 kg (250 lb)] 113.399 kg (250 lb) (11/11 0500) Last BM Date: 08/26/15  Intake/Output from previous day: 11/10 0701 - 11/11 0700 In: 2797.5 [I.V.:2657.5; NG/GT:90; IV Piggyback:50] Out: 1160 [Urine:950; Emesis/NG output:110; Blood:100] Intake/Output this shift: Total I/O In: 428.3 [I.V.:228.3; IV Piggyback:200] Out: -   Incision/Wound:binder in place   Soft but sore BS present  Incision CDI   Lungs CTA CV slight TACHYCARDIA   Lab Results:   Recent Labs  08/27/15 1645 08/28/15 0500  WBC 12.9* 12.8*  HGB 13.6 12.4  HCT 41.8 38.3  PLT 267 283   BMET  Recent Labs  08/27/15 1645 08/28/15 0500  NA  --  139  K  --  4.2  CL  --  104  CO2  --  27  GLUCOSE  --  177*  BUN  --  14  CREATININE 0.54 0.63  CALCIUM  --  8.6*   PT/INR No results for input(s): LABPROT, INR in the last 72 hours. ABG No results for input(s): PHART, HCO3 in the last 72 hours.  Invalid input(s): PCO2, PO2  Studies/Results: No results found.  Anti-infectives: Anti-infectives    Start     Dose/Rate Route Frequency Ordered Stop   08/27/15 2000  ciprofloxacin (CIPRO) IVPB 400 mg     400 mg 200 mL/hr over 60 Minutes Intravenous Every 12 hours 08/27/15 1543 08/27/15 2149   08/27/15 1100  clindamycin (CLEOCIN) IVPB 300 mg     300 mg 100 mL/hr over 30 Minutes Intravenous To ShortStay Surgical 08/26/15 1338 08/27/15 1208      Assessment/Plan: s/p Procedure(s): LAPAROSCOPIC INCISIONAL HERNIA REPAIR CONVERTED TO OPEN (N/A) SMALL BOWEL RESECTION (N/A) D/C NGT OOB  Ambulate  Keep NPO for now   LOS: 1 day    Catherine Castillo  A. 08/28/2015

## 2015-08-29 LAB — GLUCOSE, CAPILLARY
GLUCOSE-CAPILLARY: 129 mg/dL — AB (ref 65–99)
GLUCOSE-CAPILLARY: 136 mg/dL — AB (ref 65–99)
GLUCOSE-CAPILLARY: 134 mg/dL — AB (ref 65–99)
Glucose-Capillary: 141 mg/dL — ABNORMAL HIGH (ref 65–99)
Glucose-Capillary: 146 mg/dL — ABNORMAL HIGH (ref 65–99)
Glucose-Capillary: 150 mg/dL — ABNORMAL HIGH (ref 65–99)
Glucose-Capillary: 157 mg/dL — ABNORMAL HIGH (ref 65–99)

## 2015-08-29 NOTE — Progress Notes (Signed)
General Surgery Note  LOS: 2 days  POD -  2 Days Post-Op  Assessment/Plan: 1.  LAPAROSCOPIC INCISIONAL HERNIA REPAIR CONVERTED TO OPEN, SMALL BOWEL RESECTION - 08/27/2015 - [place]  Still bowel function  Continue NPO  2.  Breast Cancer, left 3.  Diabetes Mellitus- on oral hypoglycemics at home  Glucose - 177 - 08/29/2015 4.  High blood pressure 5.  Hypercholesterolemia 6.  Morbid obesity - BMI - 34  7.  DVT prophylaxis - Lovenox   Active Problems:   Incisional hernia, incarcerated  Subjective:  Doing okay.  She is just walking around the room.  She needs to get out in the hall.  Objective:   Filed Vitals:   08/29/15 0855  BP:   Pulse:   Temp:   Resp: 16     Intake/Output from previous day:  11/11 0701 - 11/12 0700 In: 2600 [I.V.:2400; IV Piggyback:200] Out: 400 [Urine:400]  Intake/Output this shift:      Physical Exam:   General: Obese WF who is alert and oriented.    HEENT: Normal. Pupils equal. .   Lungs: Clear.  IS - 1,500 cc   Abdomen: Soft, but quiet   Wound: Clean   Lab Results:    Recent Labs  08/27/15 1645 08/28/15 0500  WBC 12.9* 12.8*  HGB 13.6 12.4  HCT 41.8 38.3  PLT 267 283    BMET   Recent Labs  08/27/15 1645 08/28/15 0500  NA  --  139  K  --  4.2  CL  --  104  CO2  --  27  GLUCOSE  --  177*  BUN  --  14  CREATININE 0.54 0.63  CALCIUM  --  8.6*    PT/INR  No results for input(s): LABPROT, INR in the last 72 hours.  ABG  No results for input(s): PHART, HCO3 in the last 72 hours.  Invalid input(s): PCO2, PO2   Studies/Results:  No results found.   Anti-infectives:   Anti-infectives    Start     Dose/Rate Route Frequency Ordered Stop   08/27/15 2000  ciprofloxacin (CIPRO) IVPB 400 mg     400 mg 200 mL/hr over 60 Minutes Intravenous Every 12 hours 08/27/15 1543 08/27/15 2149   08/27/15 1100  clindamycin (CLEOCIN) IVPB 300 mg     300 mg 100 mL/hr over 30 Minutes Intravenous To ShortStay Surgical 08/26/15 1338  08/27/15 1208      Alphonsa Overall, MD, FACS Pager: [facility] Office: [phone] 08/29/2015

## 2015-08-29 NOTE — Progress Notes (Signed)
Pt evaluated by Dr Lucia Gaskins. Verbal order given not to administer po meds at this time

## 2015-08-29 NOTE — Progress Notes (Signed)
Pt had another episode of chills, this time with a low grade fever of 101.2. HR also slightly elevated at 113. Scheduled toradol administered

## 2015-08-29 NOTE — Progress Notes (Signed)
Pt c/o chills but has no fever as charted. Room temperature adjusted. Will monitor condition

## 2015-08-29 NOTE — Progress Notes (Signed)
Pt keeps messing with dressing to right foot despite being advised not to. Very non-compliant

## 2015-08-29 NOTE — Progress Notes (Signed)
Pt reports that she does not have the chills anymore

## 2015-08-29 NOTE — Progress Notes (Signed)
MD paged, order given for morning cbc. Advised to monitor for response to toradol

## 2015-08-30 LAB — CBC WITH DIFFERENTIAL/PLATELET
BASOS ABS: 0 10*3/uL (ref 0.0–0.1)
BASOS PCT: 0 %
Eosinophils Absolute: 0.2 10*3/uL (ref 0.0–0.7)
Eosinophils Relative: 2 %
HEMATOCRIT: 31.1 % — AB (ref 36.0–46.0)
HEMOGLOBIN: 9.6 g/dL — AB (ref 12.0–15.0)
LYMPHS PCT: 13 %
Lymphs Abs: 1.2 10*3/uL (ref 0.7–4.0)
MCH: 28.1 pg (ref 26.0–34.0)
MCHC: 30.9 g/dL (ref 30.0–36.0)
MCV: 90.9 fL (ref 78.0–100.0)
MONOS PCT: 10 %
Monocytes Absolute: 1 10*3/uL (ref 0.1–1.0)
NEUTROS ABS: 7.1 10*3/uL (ref 1.7–7.7)
NEUTROS PCT: 75 %
Platelets: 209 10*3/uL (ref 150–400)
RBC: 3.42 MIL/uL — ABNORMAL LOW (ref 3.87–5.11)
RDW: 13.7 % (ref 11.5–15.5)
WBC: 9.5 10*3/uL (ref 4.0–10.5)

## 2015-08-30 LAB — GLUCOSE, CAPILLARY
GLUCOSE-CAPILLARY: 149 mg/dL — AB (ref 65–99)
Glucose-Capillary: 146 mg/dL — ABNORMAL HIGH (ref 65–99)
Glucose-Capillary: 152 mg/dL — ABNORMAL HIGH (ref 65–99)
Glucose-Capillary: 123 mg/dL — ABNORMAL HIGH (ref 65–99)
Glucose-Capillary: 134 mg/dL — ABNORMAL HIGH (ref 65–99)
Glucose-Capillary: 150 mg/dL — ABNORMAL HIGH (ref 65–99)

## 2015-08-30 MED ORDER — HYDROMORPHONE HCL 1 MG/ML IJ SOLN
0.5000 mg | INTRAMUSCULAR | Status: DC | PRN
  Administered 2015-08-30 – 2015-08-31 (×7): 1 mg via INTRAVENOUS
  Administered 2015-09-01: 2 mg via INTRAVENOUS
  Administered 2015-09-01 (×2): 1 mg via INTRAVENOUS
  Administered 2015-09-02: 2 mg via INTRAVENOUS
  Filled 2015-08-30 (×5): qty 1
  Filled 2015-08-30: qty 2
  Filled 2015-08-30 (×2): qty 1
  Filled 2015-08-30 (×2): qty 2
  Filled 2015-08-30: qty 1

## 2015-08-30 NOTE — Progress Notes (Signed)
General Surgery Note  LOS: 3 days  POD -  3 Days Post-Op  Assessment/Plan: 1.  LAPAROSCOPIC INCISIONAL HERNIA REPAIR CONVERTED TO OPEN, SMALL BOWEL RESECTION - 08/27/2015 - [place]  Still no bowel function  Continue NPO - except she will start sips of water  2.  Fever  Probable pulmonary  CBC ordered has not been done yet  Encourage pulm toilet and ambulation  3.  Breast Cancer, left 4.  Diabetes Mellitus- on oral hypoglycemics at home  Glucose - 152 - 08/30/2015 5.  High blood pressure 6.  Hypercholesterolemia 7.  Morbid obesity - BMI - 34  8.  DVT prophylaxis - Lovenox   Active Problems:   Incisional hernia, incarcerated  Subjective:  Doing okay.  No flatus or BM.  Had fever, shakes last PM.  Okay this AM.  Objective:   Filed Vitals:   08/30/15 0831  BP:   Pulse:   Temp:   Resp: 15     Intake/Output from previous day:  11/12 0701 - 11/13 0700 In: 2433.3 [I.V.:2433.3] Out: 790 [Urine:790]  Intake/Output this shift:      Physical Exam:   General: Obese WF who is alert and oriented.    HEENT: Normal. Pupils equal. .   Lungs: Clear.  IS - 1,400 cc   Abdomen: Soft.  Has a few BS.   Wound: Clean   Lab Results:     Recent Labs  08/27/15 1645 08/28/15 0500  WBC 12.9* 12.8*  HGB 13.6 12.4  HCT 41.8 38.3  PLT 267 283    BMET    Recent Labs  08/27/15 1645 08/28/15 0500  NA  --  139  K  --  4.2  CL  --  104  CO2  --  27  GLUCOSE  --  177*  BUN  --  14  CREATININE 0.54 0.63  CALCIUM  --  8.6*    PT/INR  No results for input(s): LABPROT, INR in the last 72 hours.  ABG  No results for input(s): PHART, HCO3 in the last 72 hours.  Invalid input(s): PCO2, PO2   Studies/Results:  No results found.   Anti-infectives:   Anti-infectives    Start     Dose/Rate Route Frequency Ordered Stop   08/27/15 2000  ciprofloxacin (CIPRO) IVPB 400 mg     400 mg 200 mL/hr over 60 Minutes Intravenous Every 12 hours 08/27/15 1543 08/27/15 2149   08/27/15 1100  clindamycin (CLEOCIN) IVPB 300 mg     300 mg 100 mL/hr over 30 Minutes Intravenous To ShortStay Surgical 08/26/15 1338 08/27/15 1208      Alphonsa Overall, MD, FACS Pager: [place] [facility] Office: [phone] 08/30/2015

## 2015-08-31 ENCOUNTER — Encounter (HOSPITAL_COMMUNITY): Payer: Self-pay | Admitting: Surgery

## 2015-08-31 LAB — GLUCOSE, CAPILLARY
GLUCOSE-CAPILLARY: 162 mg/dL — AB (ref 65–99)
GLUCOSE-CAPILLARY: 157 mg/dL — AB (ref 65–99)
GLUCOSE-CAPILLARY: 156 mg/dL — AB (ref 65–99)
GLUCOSE-CAPILLARY: 157 mg/dL — AB (ref 65–99)
Glucose-Capillary: 137 mg/dL — ABNORMAL HIGH (ref 65–99)

## 2015-08-31 NOTE — Care Management Note (Signed)
Case Management Note  Patient Details  Name: Catherine Castillo MRN: [id number] Date of Birth: [birth year]  Subjective/Objective:                    Action/Plan:   Expected Discharge Date:                  Expected Discharge Plan:  Home/Self Care  In-House Referral:     Discharge planning Services     Post Acute Care Choice:    Choice offered to:     DME Arranged:    DME Agency:     HH Arranged:    HH Agency:     Status of Service:  In process, will continue to follow  Medicare Important Message Given:    Date Medicare IM Given:    Medicare IM give by:    Date Additional Medicare IM Given:    Additional Medicare Important Message give by:     If discussed at [place] of Stay Meetings, dates discussed:    Additional Comments: UR updated  Marilu Favre, RN 08/31/2015, 3:15 PM

## 2015-08-31 NOTE — Progress Notes (Signed)
4 Days Post-Op  Subjective: Doing well.  Passing flatus.  Objective: Vital signs in last 24 hours: Temp:  [98.3 F (36.8 C)-98.7 F (37.1 C)] 98.3 F (36.8 C) (11/14 0426) Pulse Rate:  [82-94] 94 (11/14 0426) Resp:  [13-18] 18 (11/14 0426) BP: (124-151)/(68-78) 151/78 mmHg (11/14 0426) SpO2:  [92 %-99 %] 94 % (11/14 0426) Last BM Date: 08/26/15  Intake/Output from previous day: 11/13 0701 - 11/14 0700 In: 1600 [I.V.:1600] Out: 300 [Urine:300] Intake/Output this shift: Total I/O In: 800 [I.V.:800] Out: -   General appearance: alert and cooperative GI: soft, non-tender; bowel sounds normal; no masses,  no organomegaly and incision c/d/i  Lab Results:   Recent Labs  08/30/15 0933  WBC 9.5  HGB 9.6*  HCT 31.1*  PLT 209    Anti-infectives: Anti-infectives    Start     Dose/Rate Route Frequency Ordered Stop   08/27/15 2000  ciprofloxacin (CIPRO) IVPB 400 mg     400 mg 200 mL/hr over 60 Minutes Intravenous Every 12 hours 08/27/15 1543 08/27/15 2149   08/27/15 1100  clindamycin (CLEOCIN) IVPB 300 mg     300 mg 100 mL/hr over 30 Minutes Intravenous To ShortStay Surgical 08/26/15 1338 08/27/15 1208      Assessment/Plan: s/p Procedure(s): LAPAROSCOPIC INCISIONAL HERNIA REPAIR CONVERTED TO OPEN (N/A) SMALL BOWEL RESECTION (N/A) Advance diet to CLD Mobilize Leukocytosis resolved, likely just postop OK to shower  LOS: 4 days    Rosario Jacks., [facility] 08/31/2015

## 2015-09-01 LAB — GLUCOSE, CAPILLARY
GLUCOSE-CAPILLARY: 161 mg/dL — AB (ref 65–99)
GLUCOSE-CAPILLARY: 146 mg/dL — AB (ref 65–99)
GLUCOSE-CAPILLARY: 141 mg/dL — AB (ref 65–99)
Glucose-Capillary: 141 mg/dL — ABNORMAL HIGH (ref 65–99)
Glucose-Capillary: 146 mg/dL — ABNORMAL HIGH (ref 65–99)
Glucose-Capillary: 162 mg/dL — ABNORMAL HIGH (ref 65–99)

## 2015-09-01 MED ORDER — FUROSEMIDE 20 MG PO TABS
10.0000 mg | ORAL_TABLET | Freq: Every day | ORAL | Status: DC
  Administered 2015-09-01 – 2015-09-02 (×2): 10 mg via ORAL
  Filled 2015-09-01 (×2): qty 1

## 2015-09-01 MED ORDER — POLYETHYLENE GLYCOL 3350 17 G PO PACK
17.0000 g | PACK | Freq: Two times a day (BID) | ORAL | Status: DC
  Administered 2015-09-01: 17 g via ORAL
  Filled 2015-09-01 (×3): qty 1

## 2015-09-01 NOTE — Progress Notes (Signed)
5 Days Post-Op  Subjective: Lots of flatus  Objective: Vital signs in last 24 hours: Temp:  [98.1 F (36.7 C)-98.9 F (37.2 C)] 98.8 F (37.1 C) (11/15 0519) Pulse Rate:  [86-92] 86 (11/15 0519) Resp:  [18] 18 (11/15 0519) BP: (120-155)/(60-80) 138/72 mmHg (11/15 0519) SpO2:  [90 %-93 %] 90 % (11/15 0519) Last BM Date: 08/26/15  Intake/Output from previous day: 11/14 0701 - 11/15 0700 In: 1233.7 [P.O.:342; I.V.:891.7] Out: 251 [Urine:251] Intake/Output this shift: Total I/O In: -  Out: 200 [Urine:200]  Incision/Wound:CDI non distended abdomen   Lab Results:   Recent Labs  08/30/15 0933  WBC 9.5  HGB 9.6*  HCT 31.1*  PLT 209   BMET No results for input(s): NA, K, CL, CO2, GLUCOSE, BUN, CREATININE, CALCIUM in the last 72 hours. PT/INR No results for input(s): LABPROT, INR in the last 72 hours. ABG No results for input(s): PHART, HCO3 in the last 72 hours.  Invalid input(s): PCO2, PO2  Studies/Results: No results found.  Anti-infectives: Anti-infectives    Start     Dose/Rate Route Frequency Ordered Stop   08/27/15 2000  ciprofloxacin (CIPRO) IVPB 400 mg     400 mg 200 mL/hr over 60 Minutes Intravenous Every 12 hours 08/27/15 1543 08/27/15 2149   08/27/15 1100  clindamycin (CLEOCIN) IVPB 300 mg     300 mg 100 mL/hr over 30 Minutes Intravenous To ShortStay Surgical 08/26/15 1338 08/27/15 1208      Assessment/Plan: s/p Procedure(s): LAPAROSCOPIC INCISIONAL HERNIA REPAIR CONVERTED TO OPEN (N/A) SMALL BOWEL RESECTION (N/A) Adv diet Lasix for swelling  Cut back IVF Hopefully home wed  LOS: 5 days    Shaquanta Harkless A. 09/01/2015

## 2015-09-02 LAB — GLUCOSE, CAPILLARY
GLUCOSE-CAPILLARY: 125 mg/dL — AB (ref 65–99)
GLUCOSE-CAPILLARY: 147 mg/dL — AB (ref 65–99)
Glucose-Capillary: 158 mg/dL — ABNORMAL HIGH (ref 65–99)

## 2015-09-02 MED ORDER — ONDANSETRON 4 MG PO TBDP: 4.0000 mg | ORAL_TABLET | Freq: Four times a day (QID) | ORAL | Status: DC | PRN

## 2015-09-02 MED ORDER — OXYCODONE HCL 5 MG PO TABS: 5.0000 mg | ORAL_TABLET | ORAL | Status: DC | PRN

## 2015-09-02 MED ORDER — METHOCARBAMOL 500 MG PO TABS: 500.0000 mg | ORAL_TABLET | Freq: Four times a day (QID) | ORAL | Status: DC | PRN

## 2015-09-02 NOTE — Progress Notes (Signed)
AVS discharge instructions were reviewed with patient. Patient was given prescription for oxycodone to take to her pharmacy. Patient stated that she did not have any questions. Volunteer assisted patient to her transportation by wheelchair.

## 2015-09-02 NOTE — Discharge Summary (Signed)
Physician Discharge Summary  Patient ID: Catherine Castillo MRN: [id number] DOB/AGE: 11-06-1960 54 y.o.  Admit date: 08/27/2015 Discharge date: 09/02/2015  Admission Diagnoses: incisional hernia   Discharge Diagnoses:  Active Problems:   Incisional hernia, incarcerated   Discharged Condition: good  Hospital Course: Pt underwent laparoscopic converted to open  Incisional hernia repair due to adhesions and the need for SBR  Due to chronically fibrotic/ incarcerated SB.  Post op bowel function returned on POD 5.  Wound clean dry intact with staples and no redness.  Pt tolerating soft diet and having BM.  discharged on POD 5  Consults: None  Significant Diagnostic Studies: labs:  CBC    Component Value Date/Time   WBC 9.5 08/30/2015 0933   WBC 8.0 07/17/2014 0924   RBC 3.42* 08/30/2015 0933   RBC 4.22 07/17/2014 0924   HGB 9.6* 08/30/2015 0933   HGB 12.5 07/17/2014 0924   HCT 31.1* 08/30/2015 0933   HCT 37.3 07/17/2014 0924   PLT 209 08/30/2015 0933   PLT 228 07/17/2014 0924   MCV 90.9 08/30/2015 0933   MCV 88.4 07/17/2014 0924   MCH 28.1 08/30/2015 0933   MCH 29.6 07/17/2014 0924   MCHC 30.9 08/30/2015 0933   MCHC 33.5 07/17/2014 0924   RDW 13.7 08/30/2015 0933   RDW 13.3 07/17/2014 0924   LYMPHSABS 1.2 08/30/2015 0933   LYMPHSABS 2.7 07/17/2014 0924   MONOABS 1.0 08/30/2015 0933   MONOABS 0.6 07/17/2014 0924   EOSABS 0.2 08/30/2015 0933   EOSABS 0.2 07/17/2014 0924   BASOSABS 0.0 08/30/2015 0933   BASOSABS 0.0 07/17/2014 0924     Treatments: surgery: see above   Discharge Exam: Blood pressure 132/65, pulse 87, temperature 98.7 F (37.1 C), temperature source Oral, resp. rate 16, height 5\' 9"  (1.753 m), weight 113.399 kg (250 lb), SpO2 92 %. General appearance: alert and cooperative Resp: clear to auscultation bilaterally Cardio: regular rate and rhythm, S1, S2 normal, no murmur, click, rub or gallop Incision/Wound:CDI soft abdomen   Disposition: 01-Home or  Self Care  Discharge Instructions    Diet - low sodium heart healthy    Complete by:  As directed      Discharge wound care:    Complete by:  As directed   Dry gauze and neosporin to staples daily  Ok to shower     Driving Restrictions    Complete by:  As directed   No driving for 2 weeks     Increase activity slowly    Complete by:  As directed      Lifting restrictions    Complete by:  As directed   No lifting for 6 weeks            Medication List    TAKE these medications        ALPHA LIPOIC ACID PO  Take by mouth.     atorvastatin 40 MG tablet  Commonly known as:  LIPITOR  TAKE 1 TABLET AT BEDTIME     CHROMIUM PO  Take by mouth.     FISH OIL + D3 PO  Take 1 capsule by mouth daily.     levothyroxine 150 MCG tablet  Commonly known as:  SYNTHROID  Take 1 tablet (150 mcg total) by mouth daily. BRAND NAME ONLY     lisinopril 20 MG tablet  Commonly known as:  PRINIVIL,ZESTRIL  Take 1 tablet (20 mg total) by mouth daily.     MAGNESIUM ASPARTATE PO  Take by  mouth.     methocarbamol 500 MG tablet  Commonly known as:  ROBAXIN  Take 1 tablet (500 mg total) by mouth every 6 (six) hours as needed for muscle spasms.     multivitamin with minerals Tabs tablet  Take 1 tablet by mouth daily.     ondansetron 4 MG disintegrating tablet  Commonly known as:  ZOFRAN-ODT  Take 1 tablet (4 mg total) by mouth every 6 (six) hours as needed for nausea.     OVER THE COUNTER MEDICATION  Take 1 tablet by mouth daily. metagenics ultra flora IB     oxyCODONE 5 MG immediate release tablet  Commonly known as:  Oxy IR/ROXICODONE  Take 1-2 tablets (5-10 mg total) by mouth every 4 (four) hours as needed for moderate pain.     polyethylene glycol packet  Commonly known as:  MIRALAX / GLYCOLAX  Take 17 g by mouth daily.     sitaGLIPtin-metformin 50-500 MG tablet  Commonly known as:  JANUMET  Take 1 tablet by mouth 2 (two) times daily with a meal.     VITAMIN C ER PO  Take by  mouth.         Signed: Maven Varelas A. 09/02/2015, 8:09 AM

## 2015-09-02 NOTE — Progress Notes (Signed)
6 Days Post-Op  Subjective: Pt moved bowels   Objective: Vital signs in last 24 hours: Temp:  [98.7 F (37.1 C)-99.7 F (37.6 C)] 98.7 F (37.1 C) (11/16 0613) Pulse Rate:  [85-98] 87 (11/16 0613) Resp:  [16-18] 16 (11/16 0613) BP: (125-148)/(65-80) 132/65 mmHg (11/16 0613) SpO2:  [92 %-98 %] 92 % (11/16 [id number]) Last BM Date: 08/26/15  Intake/Output from previous day: 11/15 0701 - 11/16 0700 In: 1030 [P.O.:480; I.V.:550] Out: 200 [Urine:200] Intake/Output this shift:    Incision/Wound:CDI  No redness   Lab Results:   Recent Labs  08/30/15 0933  WBC 9.5  HGB 9.6*  HCT 31.1*  PLT 209   BMET No results for input(s): NA, K, CL, CO2, GLUCOSE, BUN, CREATININE, CALCIUM in the last 72 hours. PT/INR No results for input(s): LABPROT, INR in the last 72 hours. ABG No results for input(s): PHART, HCO3 in the last 72 hours.  Invalid input(s): PCO2, PO2  Studies/Results: No results found.  Anti-infectives: Anti-infectives    Start     Dose/Rate Route Frequency Ordered Stop   08/27/15 2000  ciprofloxacin (CIPRO) IVPB 400 mg     400 mg 200 mL/hr over 60 Minutes Intravenous Every 12 hours 08/27/15 1543 08/27/15 2149   08/27/15 1100  clindamycin (CLEOCIN) IVPB 300 mg     300 mg 100 mL/hr over 30 Minutes Intravenous To ShortStay Surgical 08/26/15 1338 08/27/15 1208      Assessment/Plan: s/p Procedure(s): LAPAROSCOPIC INCISIONAL HERNIA REPAIR CONVERTED TO OPEN (N/A) SMALL BOWEL RESECTION (N/A) Discharge  LOS: 6 days    Jacobe Study A. 09/02/2015

## 2015-09-28 ENCOUNTER — Other Ambulatory Visit: Payer: Self-pay | Admitting: Family Medicine

## 2015-09-28 NOTE — Telephone Encounter (Signed)
Medication filled to pharmacy as requested.   

## 2015-10-15 ENCOUNTER — Other Ambulatory Visit: Payer: Self-pay | Admitting: Family Medicine

## 2015-10-15 NOTE — Telephone Encounter (Signed)
Medication filled to pharmacy as requested.   

## 2015-11-13 ENCOUNTER — Other Ambulatory Visit: Payer: Self-pay | Admitting: Surgery

## 2015-11-13 DIAGNOSIS — R1900 Intra-abdominal and pelvic swelling, mass and lump, unspecified site: Secondary | ICD-10-CM

## 2015-11-18 ENCOUNTER — Other Ambulatory Visit: Payer: Self-pay | Admitting: Surgery

## 2015-11-18 DIAGNOSIS — R1900 Intra-abdominal and pelvic swelling, mass and lump, unspecified site: Secondary | ICD-10-CM

## 2015-11-24 ENCOUNTER — Ambulatory Visit
Admission: RE | Admit: 2015-11-24 | Discharge: 2015-11-24 | Disposition: A | Discharge status: Home / Self Care | Payer: No Typology Code available for payment source | Source: Ambulatory Visit | Attending: Surgery | Admitting: Surgery

## 2015-11-24 DIAGNOSIS — R1900 Intra-abdominal and pelvic swelling, mass and lump, unspecified site: Secondary | ICD-10-CM

## 2015-12-16 ENCOUNTER — Other Ambulatory Visit: Payer: Self-pay | Admitting: Family Medicine

## 2015-12-16 NOTE — Telephone Encounter (Signed)
Medication filled to pharmacy as requested. Letter also mailed to inform pt to schedule Diabetes follow up.

## 2016-01-16 ENCOUNTER — Other Ambulatory Visit: Payer: Self-pay | Admitting: Family Medicine

## 2016-01-18 ENCOUNTER — Ambulatory Visit: Payer: Self-pay | Admitting: Family Medicine

## 2016-03-03 ENCOUNTER — Ambulatory Visit (INDEPENDENT_AMBULATORY_CARE_PROVIDER_SITE_OTHER): Payer: No Typology Code available for payment source | Admitting: Family Medicine

## 2016-03-03 ENCOUNTER — Encounter: Payer: Self-pay | Admitting: Family Medicine

## 2016-03-03 VITALS — BP 128/83 | HR 80 | Temp 98.1°F | Resp 16 | Ht 69.0 in | Wt 219.5 lb

## 2016-03-03 DIAGNOSIS — E1169 Type 2 diabetes mellitus with other specified complication: Secondary | ICD-10-CM | POA: Diagnosis not present

## 2016-03-03 DIAGNOSIS — E669 Obesity, unspecified: Secondary | ICD-10-CM

## 2016-03-03 DIAGNOSIS — E785 Hyperlipidemia, unspecified: Secondary | ICD-10-CM

## 2016-03-03 DIAGNOSIS — E119 Type 2 diabetes mellitus without complications: Secondary | ICD-10-CM | POA: Diagnosis not present

## 2016-03-03 LAB — BASIC METABOLIC PANEL
BUN: 21 mg/dL (ref 6–23)
CALCIUM: 10.2 mg/dL (ref 8.4–10.5)
CHLORIDE: 100 meq/L (ref 96–112)
CO2: 29 mEq/L (ref 19–32)
CREATININE: 0.56 mg/dL (ref 0.40–1.20)
GFR: 118.83 mL/min (ref >60.00)
Glucose, Bld: 133 mg/dL — ABNORMAL HIGH (ref 70–99)
Potassium: 4.7 mEq/L (ref 3.5–5.1)
Sodium: 136 mEq/L (ref 135–145)

## 2016-03-03 LAB — HEMOGLOBIN A1C: HEMOGLOBIN A1C: 7.4 % — AB (ref 4.6–6.5)

## 2016-03-03 LAB — LIPID PANEL
CHOLESTEROL: 207 mg/dL — AB (ref 0–200)
HDL: 36.6 mg/dL — ABNORMAL LOW (ref >39.00)
LDL Cholesterol: 134 mg/dL — ABNORMAL HIGH (ref 0–99)
NonHDL: 170.03
Total CHOL/HDL Ratio: 6
Triglycerides: 178 mg/dL — ABNORMAL HIGH (ref 0.0–149.0)
VLDL: 35.6 mg/dL (ref 0.0–40.0)

## 2016-03-03 LAB — TSH: TSH: 1.21 u[IU]/mL (ref 0.35–4.50)

## 2016-03-03 LAB — HEPATIC FUNCTION PANEL
ALK PHOS: 101 U/L (ref 39–117)
ALT: 40 U/L — AB (ref 0–35)
AST: 27 U/L (ref 0–37)
Albumin: 4.6 g/dL (ref 3.5–5.2)
BILIRUBIN DIRECT: 0.1 mg/dL (ref 0.0–0.3)
TOTAL PROTEIN: 7.4 g/dL (ref 6.0–8.3)
Total Bilirubin: 0.7 mg/dL (ref 0.2–1.2)

## 2016-03-03 NOTE — Patient Instructions (Signed)
Follow up in 3-4 months to recheck diabetes We'll notify you of your lab results and make any changes if needed Keep up the good work on healthy diet and regular exercise- you look great! Schedule your eye exam Add Zyrtec daily to the Flonase to improve the allergy congestion and dizziness Drink plenty of fluids Call with any questions or concerns Thanks for sticking with [place]! Have a great summer!!!

## 2016-03-03 NOTE — Assessment & Plan Note (Signed)
Chronic problem.  Tolerating statin w/o difficulty.  Check labs.  Adjust meds prn  

## 2016-03-03 NOTE — Progress Notes (Signed)
Pre visit review using our clinic review tool, if applicable. No additional management support is needed unless otherwise documented below in the visit note. 

## 2016-03-03 NOTE — Assessment & Plan Note (Signed)
Chronic problem.  Denies symptomatic lows.  UTD on foot exam.  On ACE for renal protection.  Due for eye exam- pt plans to schedule.  Check labs.  Adjust meds prn

## 2016-03-03 NOTE — Assessment & Plan Note (Signed)
Ongoing issue for pt.  Applauded her recent weight loss by exercise and dietary changes.  Will continue to follow.

## 2016-03-03 NOTE — Progress Notes (Signed)
   Subjective:    Patient ID: Catherine Castillo, female    DOB: August 27, 1961, 55 y.o.   MRN: [id number]  HPI DM- chronic problem.  On Janumet.  On ACE for renal protection.  UTD on foot exam.  Due for eye exam- pt plans to schedule.  Denies CP, SOB, HAs, visual changes, abd pain, N/V/D, symptomatic lows, numbness/tingling of hands/feet.  Hyperlipidemia- chronic problem, on Lipitor daily.  Pt has hx of elevated liver enzymes but at last check was normal.  Denies abd pain, N/V, myalgias.  Obesity- pt has lost 10 lbs since last visit!  Exercising regularly, has changed diet.  Allergic rhinitis- pt is using Flonase but continues to have pressure and some vertigo.  Not on daily antihistamine.   Review of Systems For ROS see HPI     Objective:   Physical Exam  Constitutional: She is oriented to person, place, and time. She appears well-developed and well-nourished. No distress.  HENT:  Head: Normocephalic and atraumatic.  Fluid present behind TMs bilaterally  Eyes: Conjunctivae and EOM are normal. Pupils are equal, round, and reactive to light.  Neck: Normal range of motion. Neck supple. No thyromegaly present.  Cardiovascular: Normal rate, regular rhythm, normal heart sounds and intact distal pulses.   No murmur heard. Pulmonary/Chest: Effort normal and breath sounds normal. No respiratory distress.  Abdominal: Soft. She exhibits no distension. There is no tenderness.  Musculoskeletal: She exhibits no edema.  Lymphadenopathy:    She has no cervical adenopathy.  Neurological: She is alert and oriented to person, place, and time.  Skin: Skin is warm and dry.  Psychiatric: She has a normal mood and affect. Her behavior is normal.  Vitals reviewed.         Assessment & Plan:

## 2016-03-10 ENCOUNTER — Other Ambulatory Visit: Payer: Self-pay | Admitting: Family Medicine

## 2016-03-10 NOTE — Telephone Encounter (Signed)
Medication filled to pharmacy as requested.   

## 2016-04-25 ENCOUNTER — Other Ambulatory Visit: Payer: Self-pay | Admitting: Family Medicine

## 2016-04-25 NOTE — Telephone Encounter (Signed)
Medication filled to pharmacy as requested.   

## 2016-06-11 LAB — HM DIABETES EYE EXAM

## 2016-07-19 ENCOUNTER — Telehealth: Payer: Self-pay | Admitting: Emergency Medicine

## 2016-07-19 NOTE — Telephone Encounter (Signed)
[facility] pharmacy initiated prior authorization for Janumet 50-500mg  bid thru cover my meds I completed the PA thru cover my meds KeyTildon Husky [id number] Approval through 07/19/2017

## 2016-08-25 ENCOUNTER — Encounter: Payer: Self-pay | Admitting: Family Medicine

## 2016-09-06 LAB — HM MAMMOGRAPHY

## 2016-09-13 ENCOUNTER — Encounter: Payer: Self-pay | Admitting: General Practice

## 2016-09-20 ENCOUNTER — Other Ambulatory Visit: Payer: Self-pay | Admitting: Family Medicine

## 2016-10-04 ENCOUNTER — Ambulatory Visit: Payer: Self-pay | Admitting: Family Medicine

## 2016-10-21 ENCOUNTER — Other Ambulatory Visit: Payer: Self-pay | Admitting: Family Medicine

## 2016-10-30 ENCOUNTER — Encounter: Payer: Self-pay | Admitting: Family Medicine

## 2016-10-31 ENCOUNTER — Other Ambulatory Visit: Payer: Self-pay | Admitting: Emergency Medicine

## 2016-10-31 MED ORDER — LISINOPRIL 20 MG PO TABS: 20.0000 mg | ORAL_TABLET | Freq: Every day | ORAL | 1 refills | Status: DC

## 2016-12-12 ENCOUNTER — Encounter: Payer: Self-pay | Admitting: Family Medicine

## 2016-12-12 ENCOUNTER — Ambulatory Visit (INDEPENDENT_AMBULATORY_CARE_PROVIDER_SITE_OTHER): Payer: No Typology Code available for payment source | Admitting: Family Medicine

## 2016-12-12 VITALS — BP 124/84 | HR 83 | Temp 98.1°F | Resp 16 | Ht 69.0 in | Wt 232.1 lb

## 2016-12-12 DIAGNOSIS — E785 Hyperlipidemia, unspecified: Secondary | ICD-10-CM | POA: Diagnosis not present

## 2016-12-12 DIAGNOSIS — Z1159 Encounter for screening for other viral diseases: Secondary | ICD-10-CM

## 2016-12-12 DIAGNOSIS — E1169 Type 2 diabetes mellitus with other specified complication: Secondary | ICD-10-CM

## 2016-12-12 DIAGNOSIS — E119 Type 2 diabetes mellitus without complications: Secondary | ICD-10-CM

## 2016-12-12 DIAGNOSIS — I10 Essential (primary) hypertension: Secondary | ICD-10-CM | POA: Diagnosis not present

## 2016-12-12 LAB — CBC WITH DIFFERENTIAL/PLATELET
BASOS PCT: 0.6 % (ref 0.0–3.0)
Basophils Absolute: 0 10*3/uL (ref 0.0–0.1)
EOS ABS: 0.3 10*3/uL (ref 0.0–0.7)
Eosinophils Relative: 4.1 % (ref 0.0–5.0)
HEMATOCRIT: 38.8 % (ref 36.0–46.0)
HEMOGLOBIN: 13 g/dL (ref 12.0–15.0)
Lymphocytes Relative: 29.1 % (ref 12.0–46.0)
Lymphs Abs: 2.2 10*3/uL (ref 0.7–4.0)
MCHC: 33.6 g/dL (ref 30.0–36.0)
MCV: 83.3 fl (ref 78.0–100.0)
MONOS PCT: 6.3 % (ref 3.0–12.0)
Monocytes Absolute: 0.5 10*3/uL (ref 0.1–1.0)
NEUTROS ABS: 4.5 10*3/uL (ref 1.4–7.7)
Neutrophils Relative %: 59.9 % (ref 43.0–77.0)
PLATELETS: 351 10*3/uL (ref 150.0–400.0)
RBC: 4.66 Mil/uL (ref 3.87–5.11)
RDW: 15.1 % (ref 11.5–15.5)
WBC: 7.5 10*3/uL (ref 4.0–10.5)

## 2016-12-12 LAB — BASIC METABOLIC PANEL
BUN: 17 mg/dL (ref 6–23)
CALCIUM: 9.5 mg/dL (ref 8.4–10.5)
CO2: 27 meq/L (ref 19–32)
CREATININE: 0.5 mg/dL (ref 0.40–1.20)
Chloride: 101 mEq/L (ref 96–112)
GFR: 135.06 mL/min (ref >60.00)
GLUCOSE: 177 mg/dL — AB (ref 70–99)
Potassium: 4.4 mEq/L (ref 3.5–5.1)
Sodium: 137 mEq/L (ref 135–145)

## 2016-12-12 LAB — LIPID PANEL
CHOL/HDL RATIO: 5
Cholesterol: 169 mg/dL (ref 0–200)
HDL: 36.8 mg/dL — ABNORMAL LOW (ref >39.00)
LDL Cholesterol: 100 mg/dL — ABNORMAL HIGH (ref 0–99)
NONHDL: 132.17
Triglycerides: 159 mg/dL — ABNORMAL HIGH (ref 0.0–149.0)
VLDL: 31.8 mg/dL (ref 0.0–40.0)

## 2016-12-12 LAB — HEPATIC FUNCTION PANEL
ALT: 32 U/L (ref 0–35)
AST: 21 U/L (ref 0–37)
Albumin: 4.2 g/dL (ref 3.5–5.2)
Alkaline Phosphatase: 101 U/L (ref 39–117)
BILIRUBIN DIRECT: 0.1 mg/dL (ref 0.0–0.3)
BILIRUBIN TOTAL: 0.6 mg/dL (ref 0.2–1.2)
Total Protein: 6.9 g/dL (ref 6.0–8.3)

## 2016-12-12 LAB — HEMOGLOBIN A1C: Hgb A1c MFr Bld: 8.4 % — ABNORMAL HIGH (ref 4.6–6.5)

## 2016-12-12 LAB — TSH: TSH: 1.82 u[IU]/mL (ref 0.35–4.50)

## 2016-12-12 NOTE — Assessment & Plan Note (Signed)
Chronic problem.  Adequate control today.  Asymptomatic.  Check labs.  No anticipated med changes.  Will follow. 

## 2016-12-12 NOTE — Progress Notes (Signed)
Pre visit review using our clinic review tool, if applicable. No additional management support is needed unless otherwise documented below in the visit note. 

## 2016-12-12 NOTE — Progress Notes (Signed)
   Subjective:    Patient ID: Catherine Castillo, female    DOB: 07-07-1961, 56 y.o.   MRN: [id number]  HPI DM- chronic problem, on Janumet 50/500mg  BID.  On ACE for renal protection.  UTD on eye exam.  Due for foot exam.  Pt has gained 12 lbs since last visit.  Pt reports 'a few months of being terrible'.  Pt reports home CBGs 'are higher than they've ever been'.  No symptomatic lows.  No numbness/tingling of hands/feet.  HTN- chronic problem, on Lisinopril daily.  Well controlled today.  No CP, SOB, HAs, visual changes, edema.  Hyperlipidemia- chronic problem.  On Lipitor 40mg  daily.  No abd pain, N/V, myalgias.   Review of Systems For ROS see HPI     Objective:   Physical Exam  Constitutional: She is oriented to person, place, and time. She appears well-developed and well-nourished. No distress.  HENT:  Head: Normocephalic and atraumatic.  Eyes: Conjunctivae and EOM are normal. Pupils are equal, round, and reactive to light.  Neck: Normal range of motion. Neck supple. No thyromegaly present.  Cardiovascular: Normal rate, regular rhythm, normal heart sounds and intact distal pulses.   No murmur heard. Pulmonary/Chest: Effort normal and breath sounds normal. No respiratory distress.  Abdominal: Soft. She exhibits no distension. There is no tenderness.  Musculoskeletal: She exhibits no edema.  Lymphadenopathy:    She has no cervical adenopathy.  Neurological: She is alert and oriented to person, place, and time.  Skin: Skin is warm and dry.  Psychiatric: She has a normal mood and affect. Her behavior is normal.  Vitals reviewed.  Diabetic Foot Exam - Simple   Simple Foot Form Diabetic Foot exam was performed with the following findings:  Yes 12/12/2016  9:51 AM  Visual Inspection No deformities, no ulcerations, no other skin breakdown bilaterally:  Yes Sensation Testing Intact to touch and monofilament testing bilaterally:  Yes Pulse Check Posterior Tibialis and Dorsalis pulse  intact bilaterally:  Yes Comments            Assessment & Plan:

## 2016-12-12 NOTE — Assessment & Plan Note (Signed)
Chronic problem.  Pt admits to poor dietary and exercise habits.  She has gained 12 lbs in the last few months.  Stressed need for low carb diet and regular exercise.  Check labs.  Adjust meds prn

## 2016-12-12 NOTE — Patient Instructions (Addendum)
Schedule your complete physical in 3-4 months We'll notify you of your lab results and make any changes if needed Continue to work on healthy diet and regular exercise- you can do it!!! Call with any questions or concerns [place]!!!

## 2016-12-12 NOTE — Assessment & Plan Note (Signed)
Chronic problem.  Tolerating statin w/o difficulty.  Stressed need for healthy diet and regular exercise.  Check labs.  Adjust meds prn  

## 2016-12-13 ENCOUNTER — Telehealth: Payer: Self-pay | Admitting: *Deleted

## 2016-12-13 ENCOUNTER — Encounter: Payer: Self-pay | Admitting: Family Medicine

## 2016-12-13 ENCOUNTER — Other Ambulatory Visit: Payer: Self-pay | Admitting: General Practice

## 2016-12-13 LAB — HEPATITIS C ANTIBODY: HCV Ab: NEGATIVE

## 2016-12-13 MED ORDER — EMPAGLIFLOZIN 10 MG PO TABS: 10.0000 mg | ORAL_TABLET | Freq: Every day | ORAL | 3 refills | Status: DC

## 2016-12-13 NOTE — Telephone Encounter (Signed)
PA started and completed through Cover My Meds.   Medication was approved  KEY: [id number] Approved through 12/13/2017  Information faxed to pharmacy

## 2016-12-14 MED ORDER — LEVOTHYROXINE SODIUM 150 MCG PO TABS: 150.0000 ug | ORAL_TABLET | Freq: Every day | ORAL | 1 refills | Status: DC

## 2016-12-14 MED ORDER — ATORVASTATIN CALCIUM 40 MG PO TABS: 40.0000 mg | ORAL_TABLET | Freq: Every day | ORAL | 1 refills | Status: DC

## 2016-12-14 MED ORDER — LISINOPRIL 20 MG PO TABS: 20.0000 mg | ORAL_TABLET | Freq: Every day | ORAL | 1 refills | Status: DC

## 2016-12-14 MED ORDER — SITAGLIPTIN PHOS-METFORMIN HCL 50-500 MG PO TABS: ORAL_TABLET | ORAL | 1 refills | Status: DC

## 2016-12-19 ENCOUNTER — Encounter: Payer: Self-pay | Admitting: Family Medicine

## 2016-12-20 MED ORDER — GLUCOSE BLOOD VI STRP: ORAL_STRIP | 12 refills | Status: DC

## 2016-12-20 MED ORDER — ONETOUCH ULTRASOFT LANCETS MISC: 12 refills | Status: DC

## 2017-01-26 LAB — HM PAP SMEAR

## 2017-01-30 ENCOUNTER — Encounter: Payer: Self-pay | Admitting: General Practice

## 2017-02-18 LAB — HM COLONOSCOPY

## 2017-03-22 ENCOUNTER — Encounter: Payer: Self-pay | Admitting: General Practice

## 2017-03-23 ENCOUNTER — Encounter: Payer: No Typology Code available for payment source | Admitting: Family Medicine

## 2017-04-11 ENCOUNTER — Other Ambulatory Visit: Payer: Self-pay | Admitting: Family Medicine

## 2017-04-17 ENCOUNTER — Encounter: Payer: No Typology Code available for payment source | Admitting: Family Medicine

## 2017-06-15 ENCOUNTER — Other Ambulatory Visit: Payer: Self-pay | Admitting: Family Medicine

## 2017-06-15 NOTE — Telephone Encounter (Signed)
Medication filled to pharmacy as requested.   

## 2017-06-20 ENCOUNTER — Other Ambulatory Visit: Payer: Self-pay | Admitting: Family Medicine

## 2017-08-08 ENCOUNTER — Encounter: Payer: Self-pay | Admitting: Family Medicine

## 2017-08-08 ENCOUNTER — Ambulatory Visit (INDEPENDENT_AMBULATORY_CARE_PROVIDER_SITE_OTHER): Payer: No Typology Code available for payment source | Admitting: Family Medicine

## 2017-08-08 VITALS — BP 130/78 | HR 80 | Temp 98.0°F | Resp 16 | Ht 69.0 in | Wt 229.1 lb

## 2017-08-08 DIAGNOSIS — G43909 Migraine, unspecified, not intractable, without status migrainosus: Secondary | ICD-10-CM

## 2017-08-08 DIAGNOSIS — Z0000 Encounter for general adult medical examination without abnormal findings: Secondary | ICD-10-CM | POA: Diagnosis not present

## 2017-08-08 DIAGNOSIS — E119 Type 2 diabetes mellitus without complications: Secondary | ICD-10-CM

## 2017-08-08 LAB — HEPATIC FUNCTION PANEL
ALBUMIN: 4.4 g/dL (ref 3.5–5.2)
ALK PHOS: 91 U/L (ref 39–117)
ALT: 54 U/L — AB (ref 0–35)
AST: 31 U/L (ref 0–37)
Bilirubin, Direct: 0.1 mg/dL (ref 0.0–0.3)
Total Bilirubin: 0.6 mg/dL (ref 0.2–1.2)
Total Protein: 7.3 g/dL (ref 6.0–8.3)

## 2017-08-08 LAB — BASIC METABOLIC PANEL
BUN: 20 mg/dL (ref 6–23)
CHLORIDE: 100 meq/L (ref 96–112)
CO2: 30 mEq/L (ref 19–32)
CREATININE: 0.61 mg/dL (ref 0.40–1.20)
Calcium: 9.8 mg/dL (ref 8.4–10.5)
GFR: 107.12 mL/min (ref >60.00)
Glucose, Bld: 155 mg/dL — ABNORMAL HIGH (ref 70–99)
POTASSIUM: 4.3 meq/L (ref 3.5–5.1)
Sodium: 139 mEq/L (ref 135–145)

## 2017-08-08 LAB — CBC WITH DIFFERENTIAL/PLATELET
BASOS PCT: 0.6 % (ref 0.0–3.0)
Basophils Absolute: 0 10*3/uL (ref 0.0–0.1)
EOS ABS: 0.2 10*3/uL (ref 0.0–0.7)
EOS PCT: 2.7 % (ref 0.0–5.0)
HCT: 42.3 % (ref 36.0–46.0)
Hemoglobin: 13.9 g/dL (ref 12.0–15.0)
LYMPHS ABS: 1.9 10*3/uL (ref 0.7–4.0)
Lymphocytes Relative: 25.7 % (ref 12.0–46.0)
MCHC: 32.9 g/dL (ref 30.0–36.0)
MCV: 85.9 fl (ref 78.0–100.0)
MONO ABS: 0.5 10*3/uL (ref 0.1–1.0)
Monocytes Relative: 7 % (ref 3.0–12.0)
NEUTROS PCT: 64 % (ref 43.0–77.0)
Neutro Abs: 4.8 10*3/uL (ref 1.4–7.7)
Platelets: 343 10*3/uL (ref 150.0–400.0)
RBC: 4.93 Mil/uL (ref 3.87–5.11)
RDW: 15.8 % — ABNORMAL HIGH (ref 11.5–15.5)
WBC: 7.4 10*3/uL (ref 4.0–10.5)

## 2017-08-08 LAB — LIPID PANEL
CHOL/HDL RATIO: 5
Cholesterol: 181 mg/dL (ref 0–200)
HDL: 35.9 mg/dL — ABNORMAL LOW (ref >39.00)
NONHDL: 145.29
Triglycerides: 201 mg/dL — ABNORMAL HIGH (ref 0.0–149.0)
VLDL: 40.2 mg/dL — AB (ref 0.0–40.0)

## 2017-08-08 LAB — HEMOGLOBIN A1C: Hgb A1c MFr Bld: 8.3 % — ABNORMAL HIGH (ref 4.6–6.5)

## 2017-08-08 LAB — VITAMIN D 25 HYDROXY (VIT D DEFICIENCY, FRACTURES): VITD: 36.71 ng/mL (ref 30.00–100.00)

## 2017-08-08 LAB — TSH: TSH: 0.24 u[IU]/mL — AB (ref 0.35–4.50)

## 2017-08-08 LAB — LDL CHOLESTEROL, DIRECT: LDL DIRECT: 109 mg/dL

## 2017-08-08 NOTE — Progress Notes (Signed)
   Subjective:    Patient ID: Catherine Castillo, female    DOB: Feb 18, 1961, 56 y.o.   MRN: [id number]  HPI CPE- UTD on GYN (pap, mammo), colonoscopy.  UTD on foot exam, on ACE.  Due for eye exam.     Review of Systems Patient reports no vision/ hearing changes, adenopathy,fever, weight change,  persistant/recurrent hoarseness , swallowing issues, chest pain, palpitations, edema, persistant/recurrent cough, hemoptysis, dyspnea (rest/exertional/paroxysmal nocturnal), gastrointestinal bleeding (melena, rectal bleeding), abdominal pain, significant heartburn, bowel changes, GU symptoms (dysuria, hematuria, incontinence), Gyn symptoms (abnormal  bleeding, pain),  syncope, focal weakness, memory loss, numbness & tingling, skin/hair/nail changes, abnormal bruising or bleeding, anxiety, or depression.   + migraines- pt has had migraines since age 89.  Will develop aura, then aphasia and some weakness.  Pt had 3 migraines in the last year and had to leave work- asking for [employer].      Objective:   Physical Exam General Appearance:    Alert, cooperative, no distress, appears stated age, obese  Head:    Normocephalic, without obvious abnormality, atraumatic  Eyes:    PERRL, conjunctiva/corneas clear, EOM's intact, fundi    benign, both eyes  Ears:    Normal TM's and external ear canals, both ears  Nose:   Nares normal, septum midline, mucosa normal, no drainage    or sinus tenderness  Throat:   Lips, mucosa, and tongue normal; teeth and gums normal  Neck:   Supple, symmetrical, trachea midline, no adenopathy;    Thyroid: no enlargement/tenderness/nodules  Back:     Symmetric, no curvature, ROM normal, no CVA tenderness  Lungs:     Clear to auscultation bilaterally, respirations unlabored  Chest Wall:    No tenderness or deformity   Heart:    Regular rate and rhythm, S1 and S2 normal, no murmur, rub   or gallop  Breast Exam:    Deferred to GYN  Abdomen:     Soft, non-tender, bowel sounds active all  four quadrants,    no masses, no organomegaly  Genitalia:    Deferred to GYN  Rectal:    Extremities:   Extremities normal, atraumatic, no cyanosis or edema  Pulses:   2+ and symmetric all extremities  Skin:   Skin color, texture, turgor normal, no rashes or lesions  Lymph nodes:   Cervical, supraclavicular, and axillary nodes normal  Neurologic:   CNII-XII intact, normal strength, sensation and reflexes    throughout          Assessment & Plan:

## 2017-08-08 NOTE — Assessment & Plan Note (Signed)
Chronic problem.  On ACE for renal protection, foot exam done today.  Overdue for eye exam- pt to schedule.  Check labs.  Adjust meds prn.

## 2017-08-08 NOTE — Patient Instructions (Signed)
Follow up in 3-4 months to recheck diabetes We'll notify you of your lab results and make any changes if needed Continue to work on healthy diet and regular exercise- you can do it!!! Call and schedule your eye exam and have them send me a copy of their notes Call with any questions or concerns [place]!!!

## 2017-08-08 NOTE — Assessment & Plan Note (Signed)
Pt's PE WNL w/ exception of obesity.  UTD on GYN, colonoscopy.  Check labs.  Anticipatory guidance provided.  

## 2017-08-10 ENCOUNTER — Telehealth: Payer: Self-pay | Admitting: *Deleted

## 2017-08-10 ENCOUNTER — Encounter: Payer: Self-pay | Admitting: Family Medicine

## 2017-08-10 ENCOUNTER — Other Ambulatory Visit: Payer: Self-pay | Admitting: Family Medicine

## 2017-08-10 ENCOUNTER — Other Ambulatory Visit: Payer: Self-pay | Admitting: General Practice

## 2017-08-10 DIAGNOSIS — E039 Hypothyroidism, unspecified: Secondary | ICD-10-CM

## 2017-08-10 MED ORDER — LEVOTHYROXINE SODIUM 137 MCG PO TABS: 137.0000 ug | ORAL_TABLET | Freq: Every day | ORAL | 3 refills | Status: DC

## 2017-08-10 MED ORDER — DULAGLUTIDE 0.75 MG/0.5ML ~~LOC~~ SOAJ: 0.7500 mg | SUBCUTANEOUS | 6 refills | Status: DC

## 2017-08-10 NOTE — Telephone Encounter (Signed)
Approval has been given through insurance.  Pharmacy has been notified for filling of medication.

## 2017-08-10 NOTE — Telephone Encounter (Signed)
PA started through Cover My Meds   KEY: [id number]

## 2017-09-28 ENCOUNTER — Other Ambulatory Visit: Payer: Self-pay | Admitting: Family Medicine

## 2017-10-19 ENCOUNTER — Other Ambulatory Visit (INDEPENDENT_AMBULATORY_CARE_PROVIDER_SITE_OTHER): Payer: BLUE CROSS/BLUE SHIELD

## 2017-10-19 DIAGNOSIS — E039 Hypothyroidism, unspecified: Secondary | ICD-10-CM | POA: Diagnosis not present

## 2017-10-19 LAB — TSH: TSH: 0.58 u[IU]/mL (ref 0.35–4.50)

## 2017-10-20 ENCOUNTER — Encounter: Payer: Self-pay | Admitting: General Practice

## 2017-10-27 DIAGNOSIS — Z1231 Encounter for screening mammogram for malignant neoplasm of breast: Secondary | ICD-10-CM | POA: Diagnosis not present

## 2017-10-27 DIAGNOSIS — Z853 Personal history of malignant neoplasm of breast: Secondary | ICD-10-CM | POA: Diagnosis not present

## 2017-11-27 ENCOUNTER — Other Ambulatory Visit: Payer: Self-pay | Admitting: General Practice

## 2017-11-27 ENCOUNTER — Encounter: Payer: Self-pay | Admitting: Family Medicine

## 2017-11-27 ENCOUNTER — Other Ambulatory Visit: Payer: Self-pay | Admitting: Family Medicine

## 2017-11-27 MED ORDER — SYNTHROID 137 MCG PO TABS: ORAL_TABLET | ORAL | 1 refills | Status: DC

## 2017-12-13 ENCOUNTER — Telehealth: Payer: Self-pay | Admitting: Family Medicine

## 2017-12-13 NOTE — Telephone Encounter (Signed)
Copied from [place] [id number]. Topic: Quick Communication - See Telephone Encounter >> Dec 13, 2017  2:37 PM Vernona Rieger wrote: CRM for notification. See Telephone encounter for:   12/13/17.  Cover my meds called and stated that the patient needs a renewal on JARDIANCE 10mg . They need a PA on this. They are faxing something over now.

## 2017-12-14 NOTE — Telephone Encounter (Signed)
Called pt and LMOVM to return call to discuss medications.

## 2017-12-14 NOTE — Telephone Encounter (Signed)
Please advise? Received PA for jardiance. I do not see where she is to be on the medication.

## 2017-12-14 NOTE — Telephone Encounter (Signed)
We show that this medication was d/c'd.  She is on Janumet and Trulicity.  Please call pt to clarify

## 2017-12-18 NOTE — Telephone Encounter (Signed)
Called and left another message to verify with pt. Will close encounter until pt returns call.

## 2017-12-18 NOTE — Telephone Encounter (Signed)
Noted  

## 2017-12-18 NOTE — Telephone Encounter (Signed)
Pt called back to clarify she has stopped the Jardiance. Only taking the trulicity and Janumet.  Pt states she does not need anything now and does not know why they would have called.  Nothing further needed

## 2017-12-23 ENCOUNTER — Other Ambulatory Visit: Payer: Self-pay | Admitting: Family Medicine

## 2018-01-24 ENCOUNTER — Encounter: Payer: Self-pay | Admitting: Family Medicine

## 2018-01-24 MED ORDER — SITAGLIPTIN PHOS-METFORMIN HCL 50-500 MG PO TABS: ORAL_TABLET | ORAL | 1 refills | Status: DC

## 2018-01-24 MED ORDER — LISINOPRIL 20 MG PO TABS: ORAL_TABLET | ORAL | 1 refills | Status: DC

## 2018-01-24 MED ORDER — SYNTHROID 137 MCG PO TABS: ORAL_TABLET | ORAL | 1 refills | Status: DC

## 2018-01-24 MED ORDER — ATORVASTATIN CALCIUM 40 MG PO TABS: ORAL_TABLET | ORAL | 1 refills | Status: DC

## 2018-01-24 NOTE — Telephone Encounter (Signed)
Ok to print a 90 day prescription for pt? She has a follow up appointment on 01/29/18.

## 2018-01-30 ENCOUNTER — Ambulatory Visit: Payer: BLUE CROSS/BLUE SHIELD | Admitting: Family Medicine

## 2018-01-31 ENCOUNTER — Other Ambulatory Visit: Payer: Self-pay | Admitting: General Practice

## 2018-01-31 MED ORDER — ATORVASTATIN CALCIUM 40 MG PO TABS: ORAL_TABLET | ORAL | 1 refills | Status: DC

## 2018-01-31 MED ORDER — SYNTHROID 137 MCG PO TABS: ORAL_TABLET | ORAL | 1 refills | Status: DC

## 2018-01-31 MED ORDER — SITAGLIPTIN PHOS-METFORMIN HCL 50-500 MG PO TABS: ORAL_TABLET | ORAL | 1 refills | Status: DC

## 2018-01-31 MED ORDER — LISINOPRIL 20 MG PO TABS: ORAL_TABLET | ORAL | 1 refills | Status: DC

## 2018-02-18 ENCOUNTER — Other Ambulatory Visit: Payer: Self-pay | Admitting: Family Medicine

## 2018-03-15 ENCOUNTER — Other Ambulatory Visit: Payer: Self-pay | Admitting: Family Medicine

## 2018-03-30 ENCOUNTER — Other Ambulatory Visit: Payer: Self-pay | Admitting: Family Medicine

## 2018-04-04 ENCOUNTER — Encounter: Payer: Self-pay | Admitting: Family Medicine

## 2018-04-04 ENCOUNTER — Other Ambulatory Visit: Payer: Self-pay | Admitting: General Practice

## 2018-04-04 MED ORDER — GLUCOSE BLOOD VI STRP: ORAL_STRIP | 12 refills | Status: DC

## 2018-04-04 MED ORDER — ACCU-CHEK SOFTCLIX LANCETS MISC: 12 refills | Status: AC

## 2018-04-04 MED ORDER — ACCU-CHEK SOFTCLIX LANCET DEV KIT: PACK | 2 refills | Status: AC

## 2018-04-04 MED ORDER — ACCU-CHEK AVIVA PLUS W/DEVICE KIT: PACK | 2 refills | Status: DC

## 2018-04-06 ENCOUNTER — Ambulatory Visit: Payer: BLUE CROSS/BLUE SHIELD | Admitting: Family Medicine

## 2018-04-06 ENCOUNTER — Encounter: Payer: Self-pay | Admitting: Family Medicine

## 2018-04-06 ENCOUNTER — Other Ambulatory Visit: Payer: Self-pay

## 2018-04-06 VITALS — BP 122/82 | HR 84 | Temp 98.0°F | Resp 15 | Ht 68.0 in | Wt 232.8 lb

## 2018-04-06 DIAGNOSIS — E1169 Type 2 diabetes mellitus with other specified complication: Secondary | ICD-10-CM | POA: Diagnosis not present

## 2018-04-06 DIAGNOSIS — I10 Essential (primary) hypertension: Secondary | ICD-10-CM | POA: Diagnosis not present

## 2018-04-06 DIAGNOSIS — E785 Hyperlipidemia, unspecified: Secondary | ICD-10-CM | POA: Diagnosis not present

## 2018-04-06 DIAGNOSIS — E119 Type 2 diabetes mellitus without complications: Secondary | ICD-10-CM

## 2018-04-06 DIAGNOSIS — E039 Hypothyroidism, unspecified: Secondary | ICD-10-CM | POA: Diagnosis not present

## 2018-04-06 LAB — CBC WITH DIFFERENTIAL/PLATELET
BASOS PCT: 0.8 % (ref 0.0–3.0)
Basophils Absolute: 0.1 10*3/uL (ref 0.0–0.1)
EOS PCT: 3.7 % (ref 0.0–5.0)
Eosinophils Absolute: 0.3 10*3/uL (ref 0.0–0.7)
HCT: 40.8 % (ref 36.0–46.0)
Hemoglobin: 13.9 g/dL (ref 12.0–15.0)
LYMPHS ABS: 1.9 10*3/uL (ref 0.7–4.0)
Lymphocytes Relative: 25.5 % (ref 12.0–46.0)
MCHC: 34 g/dL (ref 30.0–36.0)
MCV: 87.4 fl (ref 78.0–100.0)
MONOS PCT: 7.9 % (ref 3.0–12.0)
Monocytes Absolute: 0.6 10*3/uL (ref 0.1–1.0)
NEUTROS ABS: 4.5 10*3/uL (ref 1.4–7.7)
NEUTROS PCT: 62.1 % (ref 43.0–77.0)
PLATELETS: 315 10*3/uL (ref 150.0–400.0)
RBC: 4.67 Mil/uL (ref 3.87–5.11)
RDW: 14 % (ref 11.5–15.5)
WBC: 7.3 10*3/uL (ref 4.0–10.5)

## 2018-04-06 LAB — HEPATIC FUNCTION PANEL
ALK PHOS: 89 U/L (ref 39–117)
ALT: 79 U/L — ABNORMAL HIGH (ref 0–35)
AST: 42 U/L — ABNORMAL HIGH (ref 0–37)
Albumin: 4.4 g/dL (ref 3.5–5.2)
BILIRUBIN DIRECT: 0.1 mg/dL (ref 0.0–0.3)
BILIRUBIN TOTAL: 0.6 mg/dL (ref 0.2–1.2)
Total Protein: 7.2 g/dL (ref 6.0–8.3)

## 2018-04-06 LAB — TSH: TSH: 2.55 u[IU]/mL (ref 0.35–4.50)

## 2018-04-06 LAB — BASIC METABOLIC PANEL
BUN: 17 mg/dL (ref 6–23)
CO2: 27 mEq/L (ref 19–32)
Calcium: 9.9 mg/dL (ref 8.4–10.5)
Chloride: 100 mEq/L (ref 96–112)
Creatinine, Ser: 0.65 mg/dL (ref 0.40–1.20)
GFR: 99.32 mL/min (ref >60.00)
Glucose, Bld: 213 mg/dL — ABNORMAL HIGH (ref 70–99)
Potassium: 4.7 mEq/L (ref 3.5–5.1)
SODIUM: 136 meq/L (ref 135–145)

## 2018-04-06 LAB — HEMOGLOBIN A1C: Hgb A1c MFr Bld: 9 % — ABNORMAL HIGH (ref 4.6–6.5)

## 2018-04-06 LAB — LIPID PANEL
CHOL/HDL RATIO: 6
Cholesterol: 217 mg/dL — ABNORMAL HIGH (ref 0–200)
HDL: 35.6 mg/dL — ABNORMAL LOW (ref >39.00)
NonHDL: 181.33
Triglycerides: 224 mg/dL — ABNORMAL HIGH (ref 0.0–149.0)
VLDL: 44.8 mg/dL — AB (ref 0.0–40.0)

## 2018-04-06 LAB — LDL CHOLESTEROL, DIRECT: Direct LDL: 129 mg/dL

## 2018-04-06 NOTE — Progress Notes (Signed)
   Subjective:    Patient ID: Catherine Castillo, female    DOB: June 25, 1961, 57 y.o.   MRN: [id number]  HPI DM- chronic problem, on Janumet 50/500mg  BID, Trulicity weekly.  On ACE for renal protection.  UTD on foot exam.  Overdue on eye exam- appt has been scheduled.  'my sugar's always high'.  Reports CBGs are 'always over 200 in the AM'.  + fatigue.  Reports she is exercising and has eliminated refined sugars.  Denies symptomatic lows.  No numbness/tingling of hands/feet.  HTN- chronic problem, on Lisinopril 20mg  w/ good control.  No CP, SOB, HAs, visual changes.  Hyperlipidemia- chronic problem, on Lipitor 40mg  daily.  No abd pain, N/V  Review of Systems For ROS see HPI     Objective:   Physical Exam  Constitutional: She is oriented to person, place, and time. She appears well-developed and well-nourished. No distress.  obese  HENT:  Head: Normocephalic and atraumatic.  Eyes: Pupils are equal, round, and reactive to light. Conjunctivae and EOM are normal.  Neck: Normal range of motion. Neck supple. No thyromegaly present.  Cardiovascular: Normal rate, regular rhythm, normal heart sounds and intact distal pulses.  No murmur heard. Pulmonary/Chest: Effort normal and breath sounds normal. No respiratory distress.  Abdominal: Soft. She exhibits no distension. There is no tenderness.  Musculoskeletal: She exhibits no edema.  Lymphadenopathy:    She has no cervical adenopathy.  Neurological: She is alert and oriented to person, place, and time.  Skin: Skin is warm and dry.  Psychiatric: She has a normal mood and affect. Her behavior is normal.  Vitals reviewed.         Assessment & Plan:

## 2018-04-06 NOTE — Patient Instructions (Signed)
Schedule your complete physical for after 10/23 We'll notify you of your lab results and make any changes if needed Continue to work on healthy diet and regular exercise- you can do it! Have you eye doctor send me a copy of their report Call with any questions or concerns [place]!!!!

## 2018-04-06 NOTE — Assessment & Plan Note (Signed)
Chronic problem.  Struggling w/ fatigue.  Check labs.  Adjust meds prn

## 2018-04-06 NOTE — Assessment & Plan Note (Signed)
Chronic problem.  On Lipitor daily w/o difficulty.  Encouraged healthy diet and regular exercise.  Check labs.  Adjust meds prn

## 2018-04-06 NOTE — Assessment & Plan Note (Signed)
Chronic problem, on Janumet and Trulicity.  She reports sugars have been routinely >200.  Asymptomatic w/ exception of fatigue.  Check labs.  Adjust meds prn

## 2018-04-06 NOTE — Assessment & Plan Note (Signed)
Chronic problem, well controlled today.  Asymptomatic w/ exception of fatigue.  Check labs.  No anticipated med changes.

## 2018-04-10 ENCOUNTER — Other Ambulatory Visit: Payer: Self-pay | Admitting: General Practice

## 2018-04-10 ENCOUNTER — Encounter: Payer: Self-pay | Admitting: Family Medicine

## 2018-04-10 DIAGNOSIS — R7989 Other specified abnormal findings of blood chemistry: Secondary | ICD-10-CM

## 2018-04-10 DIAGNOSIS — R945 Abnormal results of liver function studies: Secondary | ICD-10-CM

## 2018-04-10 MED ORDER — DULAGLUTIDE 1.5 MG/0.5ML ~~LOC~~ SOAJ: 1.5000 mg | SUBCUTANEOUS | 3 refills | Status: DC

## 2018-05-21 DIAGNOSIS — E119 Type 2 diabetes mellitus without complications: Secondary | ICD-10-CM | POA: Diagnosis not present

## 2018-07-19 ENCOUNTER — Other Ambulatory Visit: Payer: Self-pay | Admitting: Family Medicine

## 2018-07-23 ENCOUNTER — Encounter: Payer: Self-pay | Admitting: Family Medicine

## 2018-07-24 MED ORDER — SYNTHROID 137 MCG PO TABS: ORAL_TABLET | ORAL | 1 refills | Status: DC

## 2018-08-04 ENCOUNTER — Other Ambulatory Visit: Payer: Self-pay | Admitting: Family Medicine

## 2018-08-09 ENCOUNTER — Other Ambulatory Visit: Payer: Self-pay | Admitting: Family Medicine

## 2018-08-22 ENCOUNTER — Telehealth: Payer: Self-pay | Admitting: *Deleted

## 2018-08-22 NOTE — Telephone Encounter (Signed)
Received a call from [employer] that the request for Trulicity could not be done electronically. He asked that I send the message to Janett Billow to let her know.

## 2018-08-22 NOTE — Telephone Encounter (Signed)
Noted  

## 2018-09-21 ENCOUNTER — Ambulatory Visit: Payer: BLUE CROSS/BLUE SHIELD | Admitting: Family Medicine

## 2018-10-17 ENCOUNTER — Other Ambulatory Visit: Payer: Self-pay | Admitting: Family Medicine

## 2018-10-24 ENCOUNTER — Other Ambulatory Visit: Payer: Self-pay | Admitting: Family Medicine

## 2018-10-30 DIAGNOSIS — Z1231 Encounter for screening mammogram for malignant neoplasm of breast: Secondary | ICD-10-CM | POA: Diagnosis not present

## 2018-10-30 DIAGNOSIS — Z853 Personal history of malignant neoplasm of breast: Secondary | ICD-10-CM | POA: Diagnosis not present

## 2018-10-30 LAB — HM MAMMOGRAPHY

## 2018-11-01 ENCOUNTER — Encounter: Payer: Self-pay | Admitting: General Practice

## 2018-12-19 DIAGNOSIS — L03021 Acute lymphangitis of right finger: Secondary | ICD-10-CM | POA: Diagnosis not present

## 2018-12-25 ENCOUNTER — Encounter: Payer: Self-pay | Admitting: Family Medicine

## 2018-12-25 DIAGNOSIS — E119 Type 2 diabetes mellitus without complications: Secondary | ICD-10-CM

## 2018-12-27 ENCOUNTER — Encounter: Payer: Self-pay | Admitting: Internal Medicine

## 2019-01-15 ENCOUNTER — Other Ambulatory Visit: Payer: Self-pay | Admitting: Family Medicine

## 2019-02-05 ENCOUNTER — Other Ambulatory Visit: Payer: Self-pay | Admitting: Family Medicine

## 2019-02-19 ENCOUNTER — Other Ambulatory Visit: Payer: Self-pay

## 2019-02-19 ENCOUNTER — Ambulatory Visit (INDEPENDENT_AMBULATORY_CARE_PROVIDER_SITE_OTHER): Payer: BLUE CROSS/BLUE SHIELD | Admitting: Family Medicine

## 2019-02-19 ENCOUNTER — Encounter: Payer: Self-pay | Admitting: Family Medicine

## 2019-02-19 VITALS — BP 129/75 | HR 68 | Temp 98.1°F | Ht 68.0 in | Wt 223.0 lb

## 2019-02-19 DIAGNOSIS — E119 Type 2 diabetes mellitus without complications: Secondary | ICD-10-CM | POA: Diagnosis not present

## 2019-02-19 DIAGNOSIS — E1169 Type 2 diabetes mellitus with other specified complication: Secondary | ICD-10-CM

## 2019-02-19 DIAGNOSIS — E785 Hyperlipidemia, unspecified: Secondary | ICD-10-CM

## 2019-02-19 DIAGNOSIS — E039 Hypothyroidism, unspecified: Secondary | ICD-10-CM | POA: Diagnosis not present

## 2019-02-19 DIAGNOSIS — I10 Essential (primary) hypertension: Secondary | ICD-10-CM | POA: Diagnosis not present

## 2019-02-19 NOTE — Progress Notes (Signed)
I have discussed the procedure for the virtual visit with the patient who has given consent to proceed with assessment and treatment.   Darald Uzzle, CMA     

## 2019-02-19 NOTE — Progress Notes (Signed)
Virtual Visit via Video   I connected with patient on 02/19/19 at  9:20 AM EDT by a video enabled telemedicine application and verified that I am speaking with the correct person using two identifiers.  Location patient: Home Location provider: [facility] Persons participating in the virtual visit: Patient, Provider, [place] ([place])  I discussed the limitations of evaluation and management by telemedicine and the availability of in person appointments. The patient expressed understanding and agreed to proceed.  Subjective:   HPI:   DM- chronic problem, on Janumet 69/629BM daily and Trulicity 8.4XL weekly.  Pt is down 9 lbs, 'i've been working on everything'.  Home CBGs 130s-170s.  Denies symptomatic lows.  On ACE for renal protection.  Due for foot exam.  UTD on eye exam- done Oct.  No numbness/tingling of hands/feet.  HTN- chronic problem, on Lisinopril 67m daily w/ good control.  No CP, SOB, HAs, visual changes, edema.  Hyperlipidemia- chronic problem, on Lipitor 428mnightly down 9 lbs since last visit.  No abd pain, N/V  Hypothyroid- chronic problem, on Synthroid 13790mdaily.  Denies fatigue, changes to skin/hair/nails.  ROS:   See pertinent positives and negatives per HPI.  Patient Active Problem List   Diagnosis Date Noted  . Migraine 08/08/2017  . Obesity 03/03/2016  . Ventral hernia 07/01/2015  . History of breast cancer 07/15/2013  . Maxillary sinusitis, acute 12/20/2012  . Post-menopausal bleeding 05/07/2012  . Arthritis   . History of chicken pox   . Diverticulitis   . H/O measles   . General medical examination 10/07/2011  . History of diverticulitis of colon 07/03/2010  . Hypothyroidism 05/11/2010  . Diabetes type 2, controlled ([facility]/26/2011  . Hyperlipidemia associated with type 2 diabetes mellitus ([facility]/26/2011  . HYPERTENSION, BENIGN ESSENTIAL 05/11/2010  . COLONIC POLYPS, HX OF 05/11/2010  . DIVERTICULITIS, HX OF  05/11/2010  . Endometrial mass 05/07/2010  . H/O menorrhagia 05/07/2010    Social History   Tobacco Use  . Smoking status: Former Smoker    Packs/day: 1.50    Years: 20.00    Pack years: 30.00    Types: Cigarettes    Last attempt to quit: 10/18/2007    Years since quitting: 11.3  . Smokeless tobacco: Never Used  Substance Use Topics  . Alcohol use: Yes    Comment: 08/27/2015 "might have 1 drink a couple times/month"    Current Outpatient Medications:  .  ACCU-CHEK SOFTCLIX LANCETS lancets, Please use one lancet each time sugars are tested. Pt is to check sugars 1-2 times daily., Disp: 100 each, Rfl: 12 .  ALPHA LIPOIC ACID PO, Take by mouth., Disp: , Rfl:  .  Ascorbic Acid (VITAMIN C ER PO), Take by mouth., Disp: , Rfl:  .  atorvastatin (LIPITOR) 40 MG tablet, TAKE 1 TABLET AT BEDTIME, Disp: 90 tablet, Rfl: 0 .  Blood Glucose Monitoring Suppl (ACCU-CHEK AVIVA PLUS) w/Device KIT, Please use glucometer to check sugars 1-2 times daily, Disp: 1 kit, Rfl: 2 .  CHROMIUM PO, Take by mouth., Disp: , Rfl:  .  Fish Oil-Cholecalciferol (FISH OIL + D3 PO), Take 1 capsule by mouth daily., Disp: , Rfl:  .  glucose blood (ACCU-CHEK AVIVA) test strip, Please use one test strip each time sugars are tested. Pt is to check sugars 1-2 times daily., Disp: 100 each, Rfl: 12 .  Lancets Misc. (ACCU-CHEK SOFTCLIX LANCET DEV) KIT, Please use one lancet each time sugars are tested. Pt checks sugars 1-2  times daily., Disp: 1 kit, Rfl: 2 .  lisinopril (ZESTRIL) 20 MG tablet, TAKE 1 TABLET DAILY, Disp: 30 tablet, Rfl: 0 .  MAGNESIUM ASPARTATE PO, Take by mouth., Disp: , Rfl:  .  Multiple Vitamin (MULTIVITAMIN WITH MINERALS) TABS, Take 1 tablet by mouth daily., Disp: , Rfl:  .  OVER THE COUNTER MEDICATION, Take 1 tablet by mouth daily. metagenics ultra flora IB, Disp: , Rfl:  .  polyethylene glycol (MIRALAX / GLYCOLAX) packet, Take 17 g by mouth daily., Disp: , Rfl:  .  sitaGLIPtin-metformin (JANUMET) 50-500 MG  tablet, TAKE 1 TABLET TWICE A DAY  WITH MEALS, Disp: 180 tablet, Rfl: 0 .  SYNTHROID 137 MCG tablet, TAKE 1 TABLET DAILY BEFORE BREAKFAST (REPLACES        SYNTHROID 150MG), Disp: 90 tablet, Rfl: 1 .  TRULICITY 1.5 OE/7.0JJ SOPN, INJECT 1.5 MG( 0.5 ML) UNDER THE SKIN ONCE A WEEK, Disp: 2 mL, Rfl: 3  Allergies  Allergen Reactions  . Amoxicillin Hives  . Penicillins Hives    Has patient had a PCN reaction causing immediate rash, facial/tongue/throat swelling, SOB or lightheadedness with hypotension: No Has patient had a PCN reaction causing severe rash involving mucus membranes or skin necrosis: No Has patient had a PCN reaction that required hospitalization No Has patient had a PCN reaction occurring within the last 10 years: No If all of the above answers are "NO", then may proceed with Cephalosporin use.     Objective:   BP 129/75   Pulse 68   Temp 98.1 F (36.7 C)   Ht 5' 8"  (1.727 m)   Wt 223 lb (101.2 kg)   BMI 33.91 kg/m   AAOx3, NAD NCAT, EOMI No obvious CN deficits Coloring WNL Pt is able to speak clearly, coherently without shortness of breath or increased work of breathing.  Thought process is linear.  Mood is appropriate.   Assessment and Plan:   DM- chronic problem.  Applauded her efforts at diet and exercise.  UTD on eye exam.  On ACE.  Due for foot exam but not able to do virtually.  Check labs.  Adjust meds prn   HTN- chronic problem, well controlled.  Asymptomatic.  Check labs.  No anticipated med changes.  Will follow.  Hyperlipidemia- chronic problem.  Tolerating statin w/o difficulty.  Check labs.  Adjust meds prn   Hypothyroid- chronic problem, currently asymptomatic.  Check labs.  Adjust meds prn    Annye Asa, MD 02/19/2019

## 2019-02-25 ENCOUNTER — Other Ambulatory Visit: Payer: Self-pay | Admitting: Family Medicine

## 2019-03-01 ENCOUNTER — Other Ambulatory Visit (INDEPENDENT_AMBULATORY_CARE_PROVIDER_SITE_OTHER): Payer: BLUE CROSS/BLUE SHIELD

## 2019-03-01 DIAGNOSIS — I10 Essential (primary) hypertension: Secondary | ICD-10-CM | POA: Diagnosis not present

## 2019-03-01 DIAGNOSIS — E1169 Type 2 diabetes mellitus with other specified complication: Secondary | ICD-10-CM | POA: Diagnosis not present

## 2019-03-01 DIAGNOSIS — E039 Hypothyroidism, unspecified: Secondary | ICD-10-CM

## 2019-03-01 DIAGNOSIS — E785 Hyperlipidemia, unspecified: Secondary | ICD-10-CM

## 2019-03-01 DIAGNOSIS — E119 Type 2 diabetes mellitus without complications: Secondary | ICD-10-CM

## 2019-03-01 LAB — CBC WITH DIFFERENTIAL/PLATELET
Basophils Absolute: 0 10*3/uL (ref 0.0–0.1)
Basophils Relative: 0.2 % (ref 0.0–3.0)
Eosinophils Absolute: 0.3 10*3/uL (ref 0.0–0.7)
Eosinophils Relative: 3.1 % (ref 0.0–5.0)
HCT: 41.6 % (ref 36.0–46.0)
Hemoglobin: 14.2 g/dL (ref 12.0–15.0)
Lymphocytes Relative: 26.4 % (ref 12.0–46.0)
Lymphs Abs: 2.6 10*3/uL (ref 0.7–4.0)
MCHC: 34.2 g/dL (ref 30.0–36.0)
MCV: 88.8 fl (ref 78.0–100.0)
Monocytes Absolute: 0.5 10*3/uL (ref 0.1–1.0)
Monocytes Relative: 5.6 % (ref 3.0–12.0)
Neutro Abs: 6.3 10*3/uL (ref 1.4–7.7)
Neutrophils Relative %: 64.7 % (ref 43.0–77.0)
Platelets: 341 10*3/uL (ref 150.0–400.0)
RBC: 4.68 Mil/uL (ref 3.87–5.11)
RDW: 13.8 % (ref 11.5–15.5)
WBC: 9.7 10*3/uL (ref 4.0–10.5)

## 2019-03-01 LAB — LIPID PANEL
Cholesterol: 169 mg/dL (ref 0–200)
HDL: 32.7 mg/dL — ABNORMAL LOW (ref >39.00)
NonHDL: 136.1
Total CHOL/HDL Ratio: 5
Triglycerides: 248 mg/dL — ABNORMAL HIGH (ref 0.0–149.0)
VLDL: 49.6 mg/dL — ABNORMAL HIGH (ref 0.0–40.0)

## 2019-03-01 LAB — HEPATIC FUNCTION PANEL
ALT: 40 U/L — ABNORMAL HIGH (ref 0–35)
AST: 23 U/L (ref 0–37)
Albumin: 4.2 g/dL (ref 3.5–5.2)
Alkaline Phosphatase: 96 U/L (ref 39–117)
Bilirubin, Direct: 0.1 mg/dL (ref 0.0–0.3)
Total Bilirubin: 0.5 mg/dL (ref 0.2–1.2)
Total Protein: 7 g/dL (ref 6.0–8.3)

## 2019-03-01 LAB — LDL CHOLESTEROL, DIRECT: Direct LDL: 92 mg/dL

## 2019-03-01 LAB — BASIC METABOLIC PANEL
BUN: 12 mg/dL (ref 6–23)
CO2: 27 mEq/L (ref 19–32)
Calcium: 9.3 mg/dL (ref 8.4–10.5)
Chloride: 101 mEq/L (ref 96–112)
Creatinine, Ser: 0.7 mg/dL (ref 0.40–1.20)
GFR: 85.52 mL/min (ref >60.00)
Glucose, Bld: 151 mg/dL — ABNORMAL HIGH (ref 70–99)
Potassium: 4.2 mEq/L (ref 3.5–5.1)
Sodium: 139 mEq/L (ref 135–145)

## 2019-03-01 LAB — TSH: TSH: 20.94 u[IU]/mL — ABNORMAL HIGH (ref 0.35–4.50)

## 2019-03-01 LAB — HEMOGLOBIN A1C: Hgb A1c MFr Bld: 7.9 % — ABNORMAL HIGH (ref 4.6–6.5)

## 2019-03-04 ENCOUNTER — Other Ambulatory Visit: Payer: Self-pay | Admitting: General Practice

## 2019-03-04 DIAGNOSIS — I10 Essential (primary) hypertension: Secondary | ICD-10-CM

## 2019-03-04 DIAGNOSIS — E039 Hypothyroidism, unspecified: Secondary | ICD-10-CM

## 2019-03-04 MED ORDER — SYNTHROID 150 MCG PO TABS: 150.0000 ug | ORAL_TABLET | Freq: Every day | ORAL | 3 refills | Status: DC

## 2019-03-15 ENCOUNTER — Other Ambulatory Visit: Payer: Self-pay | Admitting: Family Medicine

## 2019-04-03 ENCOUNTER — Ambulatory Visit (INDEPENDENT_AMBULATORY_CARE_PROVIDER_SITE_OTHER): Payer: BC Managed Care – PPO

## 2019-04-03 ENCOUNTER — Other Ambulatory Visit: Payer: Self-pay

## 2019-04-03 DIAGNOSIS — I10 Essential (primary) hypertension: Secondary | ICD-10-CM

## 2019-04-03 LAB — TSH: TSH: 4.52 u[IU]/mL — ABNORMAL HIGH (ref 0.35–4.50)

## 2019-04-15 ENCOUNTER — Other Ambulatory Visit: Payer: Self-pay | Admitting: Family Medicine

## 2019-05-06 ENCOUNTER — Other Ambulatory Visit: Payer: Self-pay | Admitting: *Deleted

## 2019-05-06 DIAGNOSIS — Z20822 Contact with and (suspected) exposure to covid-19: Secondary | ICD-10-CM

## 2019-05-06 NOTE — Progress Notes (Signed)
[id number] 

## 2019-05-06 NOTE — Addendum Note (Signed)
Addended by: Brigitte Pulse on: 05/06/2019 08:58 PM   Modules accepted: Orders

## 2019-05-08 LAB — NOVEL CORONAVIRUS, NAA: SARS-CoV-2, NAA: NOT DETECTED

## 2019-07-01 ENCOUNTER — Other Ambulatory Visit: Payer: Self-pay | Admitting: General Practice

## 2019-07-01 MED ORDER — SYNTHROID 150 MCG PO TABS: 150.0000 ug | ORAL_TABLET | Freq: Every day | ORAL | 1 refills | Status: DC

## 2019-07-07 ENCOUNTER — Other Ambulatory Visit: Payer: Self-pay | Admitting: Family Medicine

## 2019-07-12 ENCOUNTER — Encounter: Payer: Self-pay | Admitting: Family Medicine

## 2019-07-12 MED ORDER — TRULICITY 1.5 MG/0.5ML ~~LOC~~ SOAJ: SUBCUTANEOUS | 3 refills | Status: DC

## 2019-08-19 ENCOUNTER — Encounter: Payer: Self-pay | Admitting: Family Medicine

## 2019-08-19 ENCOUNTER — Other Ambulatory Visit: Payer: Self-pay | Admitting: General Practice

## 2019-08-19 MED ORDER — GLUCOSE BLOOD VI STRP: ORAL_STRIP | 12 refills | Status: DC

## 2019-08-22 ENCOUNTER — Other Ambulatory Visit: Payer: Self-pay | Admitting: Family Medicine

## 2019-09-30 ENCOUNTER — Other Ambulatory Visit: Payer: Self-pay | Admitting: Family Medicine

## 2019-10-29 ENCOUNTER — Other Ambulatory Visit: Payer: Self-pay | Admitting: General Practice

## 2019-10-29 MED ORDER — TRULICITY 1.5 MG/0.5ML ~~LOC~~ SOAJ: SUBCUTANEOUS | 3 refills | Status: DC

## 2019-11-29 DIAGNOSIS — Z1231 Encounter for screening mammogram for malignant neoplasm of breast: Secondary | ICD-10-CM | POA: Diagnosis not present

## 2019-11-29 LAB — HM MAMMOGRAPHY

## 2019-12-10 ENCOUNTER — Encounter: Payer: Self-pay | Admitting: General Practice

## 2019-12-29 ENCOUNTER — Other Ambulatory Visit: Payer: Self-pay | Admitting: Family Medicine

## 2019-12-31 DIAGNOSIS — E119 Type 2 diabetes mellitus without complications: Secondary | ICD-10-CM | POA: Diagnosis not present

## 2020-01-09 ENCOUNTER — Other Ambulatory Visit: Payer: Self-pay | Admitting: General Practice

## 2020-01-09 MED ORDER — SYNTHROID 150 MCG PO TABS: 150.0000 ug | ORAL_TABLET | Freq: Every day | ORAL | 0 refills | Status: DC

## 2020-02-06 LAB — HM DIABETES EYE EXAM

## 2020-02-13 ENCOUNTER — Other Ambulatory Visit: Payer: Self-pay | Admitting: Emergency Medicine

## 2020-02-13 DIAGNOSIS — E119 Type 2 diabetes mellitus without complications: Secondary | ICD-10-CM

## 2020-02-13 MED ORDER — TRULICITY 1.5 MG/0.5ML ~~LOC~~ SOAJ: SUBCUTANEOUS | 1 refills | Status: DC

## 2020-02-20 ENCOUNTER — Other Ambulatory Visit: Payer: Self-pay | Admitting: General Practice

## 2020-02-20 MED ORDER — LISINOPRIL 10 MG PO TABS: 10.0000 mg | ORAL_TABLET | Freq: Two times a day (BID) | ORAL | 1 refills | Status: DC

## 2020-03-27 ENCOUNTER — Other Ambulatory Visit: Payer: Self-pay | Admitting: Family Medicine

## 2020-04-07 ENCOUNTER — Other Ambulatory Visit: Payer: Self-pay

## 2020-04-07 ENCOUNTER — Encounter: Payer: Self-pay | Admitting: Family Medicine

## 2020-04-07 ENCOUNTER — Ambulatory Visit: Payer: BC Managed Care – PPO | Admitting: Family Medicine

## 2020-04-07 VITALS — BP 128/80 | HR 80 | Temp 98.0°F | Resp 16 | Ht 68.0 in | Wt 226.5 lb

## 2020-04-07 DIAGNOSIS — E785 Hyperlipidemia, unspecified: Secondary | ICD-10-CM | POA: Diagnosis not present

## 2020-04-07 DIAGNOSIS — E119 Type 2 diabetes mellitus without complications: Secondary | ICD-10-CM | POA: Diagnosis not present

## 2020-04-07 DIAGNOSIS — I10 Essential (primary) hypertension: Secondary | ICD-10-CM

## 2020-04-07 DIAGNOSIS — E1169 Type 2 diabetes mellitus with other specified complication: Secondary | ICD-10-CM | POA: Diagnosis not present

## 2020-04-07 LAB — HEPATIC FUNCTION PANEL
ALT: 36 U/L — ABNORMAL HIGH (ref 0–35)
AST: 23 U/L (ref 0–37)
Albumin: 4.2 g/dL (ref 3.5–5.2)
Alkaline Phosphatase: 97 U/L (ref 39–117)
Bilirubin, Direct: 0.1 mg/dL (ref 0.0–0.3)
Total Bilirubin: 0.5 mg/dL (ref 0.2–1.2)
Total Protein: 6.7 g/dL (ref 6.0–8.3)

## 2020-04-07 LAB — CBC WITH DIFFERENTIAL/PLATELET
Basophils Absolute: 0 10*3/uL (ref 0.0–0.1)
Basophils Relative: 0.8 % (ref 0.0–3.0)
Eosinophils Absolute: 0.2 10*3/uL (ref 0.0–0.7)
Eosinophils Relative: 2.9 % (ref 0.0–5.0)
HCT: 39.1 % (ref 36.0–46.0)
Hemoglobin: 13.4 g/dL (ref 12.0–15.0)
Lymphocytes Relative: 30.7 % (ref 12.0–46.0)
Lymphs Abs: 1.9 10*3/uL (ref 0.7–4.0)
MCHC: 34.2 g/dL (ref 30.0–36.0)
MCV: 87.8 fl (ref 78.0–100.0)
Monocytes Absolute: 0.4 10*3/uL (ref 0.1–1.0)
Monocytes Relative: 6.4 % (ref 3.0–12.0)
Neutro Abs: 3.6 10*3/uL (ref 1.4–7.7)
Neutrophils Relative %: 59.2 % (ref 43.0–77.0)
Platelets: 293 10*3/uL (ref 150.0–400.0)
RBC: 4.45 Mil/uL (ref 3.87–5.11)
RDW: 13.2 % (ref 11.5–15.5)
WBC: 6.1 10*3/uL (ref 4.0–10.5)

## 2020-04-07 LAB — BASIC METABOLIC PANEL
BUN: 14 mg/dL (ref 6–23)
CO2: 28 mEq/L (ref 19–32)
Calcium: 9.2 mg/dL (ref 8.4–10.5)
Chloride: 102 mEq/L (ref 96–112)
Creatinine, Ser: 0.5 mg/dL (ref 0.40–1.20)
GFR: 125.63 mL/min (ref >60.00)
Glucose, Bld: 168 mg/dL — ABNORMAL HIGH (ref 70–99)
Potassium: 4.1 mEq/L (ref 3.5–5.1)
Sodium: 138 mEq/L (ref 135–145)

## 2020-04-07 LAB — LIPID PANEL
Cholesterol: 164 mg/dL (ref 0–200)
HDL: 32.4 mg/dL — ABNORMAL LOW (ref >39.00)
NonHDL: 132.03
Total CHOL/HDL Ratio: 5
Triglycerides: 307 mg/dL — ABNORMAL HIGH (ref 0.0–149.0)
VLDL: 61.4 mg/dL — ABNORMAL HIGH (ref 0.0–40.0)

## 2020-04-07 LAB — HEMOGLOBIN A1C: Hgb A1c MFr Bld: 8.3 % — ABNORMAL HIGH (ref 4.6–6.5)

## 2020-04-07 LAB — LDL CHOLESTEROL, DIRECT: Direct LDL: 73 mg/dL

## 2020-04-07 LAB — TSH: TSH: 2.5 u[IU]/mL (ref 0.35–4.50)

## 2020-04-07 MED ORDER — TRULICITY 1.5 MG/0.5ML ~~LOC~~ SOAJ: SUBCUTANEOUS | 1 refills | Status: DC

## 2020-04-07 NOTE — Assessment & Plan Note (Signed)
Chronic problem.  Pt has not followed up as directed.  Foot exam today WNL.  UTD on eye exam.  On ACE for renal protection.  Discussed need for low carb diet, regular exercise, and routine followup.  Check labs.  Adjust meds prn

## 2020-04-07 NOTE — Assessment & Plan Note (Signed)
Chronic problem, on Lisinopril 10mg  w/ good control.  Asymptomatic.  Check labs.  No anticipated med changes.

## 2020-04-07 NOTE — Assessment & Plan Note (Signed)
Deteriorated.  Pt has gained 3 lbs since last visit.  Given her other medical conditions, this qualifies as morbid obesity.  Encouraged healthy diet and regular exercise.  Will follow.

## 2020-04-07 NOTE — Progress Notes (Signed)
   Subjective:    Patient ID: Catherine Castillo, female    DOB: 1961/07/29, 59 y.o.   MRN: [id number]  HPI DM- chronic problem, on Trulicity 1.5 mg weekly, Janumet 50/500mg  BID.  On ACE for renal protection.  UTD on eye exam, due for foot exam.  Pt has not followed up as directed.  Denies symptomatic lows.  No numbness/tingling of hands/feet.  HTN- chronic problem, on Lisinopril 10mg  daily w/ good control.  No CP, SOB, HAs, visual changes, edema.  Hyperlipidemia- chronic problem, on Lipitor 40mg  daily.  No abd pain, N/V.  Hypothyroid- chronic problem, on Synthroid 183mcg daily.  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  Denies changes to skin/hair/nails.  Obesity- BMI is now 34.44.  No regular exercise but active.   Review of Systems For ROS see HPI   This visit occurred during the SARS-CoV-2 public health emergency.  Safety protocols were in place, including screening questions prior to the visit, additional usage of staff PPE, and extensive cleaning of exam room while observing appropriate contact time as indicated for disinfecting solutions.       Objective:   Physical Exam        Assessment & Plan:

## 2020-04-07 NOTE — Patient Instructions (Addendum)
Follow up in 3-4 months to recheck diabetes We'll notify you of your lab results and make any changes if needed Continue to work on healthy diet and and regular exercise- you can do it! Call with any questions or concerns ENJOY YOUR VACATION!!!

## 2020-04-07 NOTE — Assessment & Plan Note (Signed)
Chronic problem, on Lipitor w/o difficulty.  Check labs.  Adjust meds prn

## 2020-04-15 ENCOUNTER — Encounter: Payer: Self-pay | Admitting: Family Medicine

## 2020-04-15 ENCOUNTER — Other Ambulatory Visit: Payer: Self-pay | Admitting: General Practice

## 2020-04-15 MED ORDER — SYNTHROID 150 MCG PO TABS: 150.0000 ug | ORAL_TABLET | Freq: Every day | ORAL | 0 refills | Status: DC

## 2020-05-27 ENCOUNTER — Other Ambulatory Visit: Payer: Self-pay | Admitting: General Practice

## 2020-05-27 ENCOUNTER — Encounter: Payer: Self-pay | Admitting: Family Medicine

## 2020-05-27 MED ORDER — SYNTHROID 150 MCG PO TABS: 150.0000 ug | ORAL_TABLET | Freq: Every day | ORAL | 1 refills | Status: DC

## 2020-06-04 ENCOUNTER — Encounter: Payer: Self-pay | Admitting: Genetic Counselor

## 2020-06-14 ENCOUNTER — Other Ambulatory Visit: Payer: Self-pay | Admitting: Family Medicine

## 2020-06-14 DIAGNOSIS — E119 Type 2 diabetes mellitus without complications: Secondary | ICD-10-CM

## 2020-06-25 ENCOUNTER — Other Ambulatory Visit: Payer: Self-pay | Admitting: Family Medicine

## 2020-07-07 DIAGNOSIS — H43391 Other vitreous opacities, right eye: Secondary | ICD-10-CM | POA: Diagnosis not present

## 2020-08-09 ENCOUNTER — Other Ambulatory Visit: Payer: Self-pay | Admitting: Family Medicine

## 2020-08-10 ENCOUNTER — Other Ambulatory Visit: Payer: Self-pay | Admitting: Family Medicine

## 2020-08-10 DIAGNOSIS — E119 Type 2 diabetes mellitus without complications: Secondary | ICD-10-CM

## 2020-08-31 ENCOUNTER — Ambulatory Visit: Payer: BC Managed Care – PPO | Admitting: Family Medicine

## 2020-09-17 ENCOUNTER — Other Ambulatory Visit: Payer: Self-pay | Admitting: Family Medicine

## 2020-09-30 ENCOUNTER — Ambulatory Visit: Payer: BC Managed Care – PPO | Admitting: Family Medicine

## 2020-10-08 ENCOUNTER — Other Ambulatory Visit: Payer: Self-pay | Admitting: Family Medicine

## 2020-10-09 ENCOUNTER — Other Ambulatory Visit: Payer: Self-pay | Admitting: Family Medicine

## 2020-10-09 DIAGNOSIS — E119 Type 2 diabetes mellitus without complications: Secondary | ICD-10-CM

## 2020-10-31 ENCOUNTER — Other Ambulatory Visit: Payer: Self-pay | Admitting: Family Medicine

## 2020-11-11 ENCOUNTER — Encounter: Payer: Self-pay | Admitting: Family Medicine

## 2020-11-11 ENCOUNTER — Other Ambulatory Visit: Payer: Self-pay

## 2020-11-11 ENCOUNTER — Ambulatory Visit: Payer: 59 | Admitting: Family Medicine

## 2020-11-11 VITALS — BP 128/70 | HR 89 | Temp 98.4°F | Resp 17 | Ht 68.0 in | Wt 218.2 lb

## 2020-11-11 DIAGNOSIS — Z780 Asymptomatic menopausal state: Secondary | ICD-10-CM

## 2020-11-11 DIAGNOSIS — I10 Essential (primary) hypertension: Secondary | ICD-10-CM | POA: Diagnosis not present

## 2020-11-11 DIAGNOSIS — E785 Hyperlipidemia, unspecified: Secondary | ICD-10-CM

## 2020-11-11 DIAGNOSIS — E119 Type 2 diabetes mellitus without complications: Secondary | ICD-10-CM | POA: Diagnosis not present

## 2020-11-11 DIAGNOSIS — E1169 Type 2 diabetes mellitus with other specified complication: Secondary | ICD-10-CM | POA: Diagnosis not present

## 2020-11-11 LAB — HEMOGLOBIN A1C: Hgb A1c MFr Bld: 8.1 % — ABNORMAL HIGH (ref 4.6–6.5)

## 2020-11-11 LAB — CBC WITH DIFFERENTIAL/PLATELET
Basophils Absolute: 0 10*3/uL (ref 0.0–0.1)
Basophils Relative: 0.6 % (ref 0.0–3.0)
Eosinophils Absolute: 0.2 10*3/uL (ref 0.0–0.7)
Eosinophils Relative: 2.7 % (ref 0.0–5.0)
HCT: 42 % (ref 36.0–46.0)
Hemoglobin: 14.2 g/dL (ref 12.0–15.0)
Lymphocytes Relative: 26.3 % (ref 12.0–46.0)
Lymphs Abs: 1.8 10*3/uL (ref 0.7–4.0)
MCHC: 33.7 g/dL (ref 30.0–36.0)
MCV: 87.8 fl (ref 78.0–100.0)
Monocytes Absolute: 0.5 10*3/uL (ref 0.1–1.0)
Monocytes Relative: 6.8 % (ref 3.0–12.0)
Neutro Abs: 4.3 10*3/uL (ref 1.4–7.7)
Neutrophils Relative %: 63.6 % (ref 43.0–77.0)
Platelets: 330 10*3/uL (ref 150.0–400.0)
RBC: 4.79 Mil/uL (ref 3.87–5.11)
RDW: 13.2 % (ref 11.5–15.5)
WBC: 6.8 10*3/uL (ref 4.0–10.5)

## 2020-11-11 LAB — BASIC METABOLIC PANEL
BUN: 19 mg/dL (ref 6–23)
CO2: 28 mEq/L (ref 19–32)
Calcium: 10.1 mg/dL (ref 8.4–10.5)
Chloride: 100 mEq/L (ref 96–112)
Creatinine, Ser: 0.6 mg/dL (ref 0.40–1.20)
GFR: 97.07 mL/min (ref >60.00)
Glucose, Bld: 130 mg/dL — ABNORMAL HIGH (ref 70–99)
Potassium: 4.3 mEq/L (ref 3.5–5.1)
Sodium: 136 mEq/L (ref 135–145)

## 2020-11-11 LAB — HEPATIC FUNCTION PANEL
ALT: 42 U/L — ABNORMAL HIGH (ref 0–35)
AST: 27 U/L (ref 0–37)
Albumin: 4.4 g/dL (ref 3.5–5.2)
Alkaline Phosphatase: 77 U/L (ref 39–117)
Bilirubin, Direct: 0.1 mg/dL (ref 0.0–0.3)
Total Bilirubin: 0.6 mg/dL (ref 0.2–1.2)
Total Protein: 7.6 g/dL (ref 6.0–8.3)

## 2020-11-11 LAB — LIPID PANEL
Cholesterol: 187 mg/dL (ref 0–200)
HDL: 38.5 mg/dL — ABNORMAL LOW (ref >39.00)
LDL Cholesterol: 116 mg/dL — ABNORMAL HIGH (ref 0–99)
NonHDL: 148.66
Total CHOL/HDL Ratio: 5
Triglycerides: 165 mg/dL — ABNORMAL HIGH (ref 0.0–149.0)
VLDL: 33 mg/dL (ref 0.0–40.0)

## 2020-11-11 LAB — TSH: TSH: 2.19 u[IU]/mL (ref 0.35–4.50)

## 2020-11-11 NOTE — Assessment & Plan Note (Signed)
Chronic problem.  Tolerating Lipitor 40mg daily w/o difficulty.  Check labs.  Adjust meds prn  

## 2020-11-11 NOTE — Assessment & Plan Note (Signed)
Chronic problem.  On Trulicity 1.5mg  weekly and Janumet 50/500mg  BID.  Last A1C was not at goal (8.3%) but pt is trying to limit her carb intake and exercise regularly.  UTD on eye exam, foot exam, and on ACE for renal protection.  Check labs.  Adjust meds prn

## 2020-11-11 NOTE — Patient Instructions (Addendum)
Follow up in 3-4 months to recheck sugar We'll notify you of your lab results and make any changes if needed Continue to work on healthy diet and regular exercise- you're doing great! Call with any questions or concerns Stay Safe!  Stay Healthy! Happy Spring!

## 2020-11-11 NOTE — Assessment & Plan Note (Signed)
Chronic problem.  Well controlled on Lisinopril 10mg  daily.  Currently asymptomatic.  Check labs.  No anticipated med changes.

## 2020-11-11 NOTE — Assessment & Plan Note (Signed)
Improved!  Pt is down 10 lbs and actually no longer in the morbidly obese range.  Applauded her efforts.  Will follow.

## 2020-11-11 NOTE — Progress Notes (Signed)
   Subjective:    Patient ID: Catherine Castillo, female    DOB: [birth year], 60 y.o.   MRN: [id number]  HPI DM- chronic problem, on Trulicity 1.5mg  weekly, Janumet 50/500mg  BID.  Last A1C 8.3%  Pt is down 10 lbs since then.  UTD on eye exam, foot exam.  On ACE for renal protection.  Denies symptomatic lows.  No numbness/tingling of hands/feet  HTN- chronic problem, on Lisinopril 10mg  BID w/ good BP control.  Denies CP, SOB, HAs, visual changes, edema.  Hyperlipidemia- chronic problem, on Lipitor 40mg  daily.  Denies abd pain, N/V  Obesity- pt is down 10 lbs.  Is now playing pickle ball 3x/week.  Has started to cut carbs- 'I was really bad during the holidays'   Review of Systems For ROS see HPI   This visit occurred during the SARS-CoV-2 public health emergency.  Safety protocols were in place, including screening questions prior to the visit, additional usage of staff PPE, and extensive cleaning of exam room while observing appropriate contact time as indicated for disinfecting solutions.       Objective:   Physical Exam Vitals reviewed.  Constitutional:      General: She is not in acute distress.    Appearance: Normal appearance. She is well-developed and well-nourished. She is obese.  HENT:     Head: Normocephalic and atraumatic.  Eyes:     Extraocular Movements: EOM normal.     Conjunctiva/sclera: Conjunctivae normal.     Pupils: Pupils are equal, round, and reactive to light.  Neck:     Thyroid: No thyromegaly.  Cardiovascular:     Rate and Rhythm: Normal rate and regular rhythm.     Pulses: Normal pulses and intact distal pulses.     Heart sounds: Normal heart sounds. No murmur heard.   Pulmonary:     Effort: Pulmonary effort is normal. No respiratory distress.     Breath sounds: Normal breath sounds.  Abdominal:     General: There is no distension.     Palpations: Abdomen is soft.     Tenderness: There is no abdominal tenderness.  Musculoskeletal:        General:  No edema.     Cervical back: Normal range of motion and neck supple.     Right lower leg: No edema.     Left lower leg: No edema.  Lymphadenopathy:     Cervical: No cervical adenopathy.  Skin:    General: Skin is warm and dry.  Neurological:     Mental Status: She is alert and oriented to person, place, and time.  Psychiatric:        Mood and Affect: Mood and affect normal.        Behavior: Behavior normal.           Assessment & Plan:

## 2020-12-05 ENCOUNTER — Other Ambulatory Visit: Payer: Self-pay | Admitting: Family Medicine

## 2020-12-05 DIAGNOSIS — E119 Type 2 diabetes mellitus without complications: Secondary | ICD-10-CM

## 2020-12-16 ENCOUNTER — Other Ambulatory Visit: Payer: Self-pay | Admitting: Family Medicine

## 2021-02-03 LAB — HM MAMMOGRAPHY

## 2021-02-05 ENCOUNTER — Other Ambulatory Visit: Payer: Self-pay | Admitting: Family Medicine

## 2021-02-18 ENCOUNTER — Encounter: Payer: 59 | Admitting: Family Medicine

## 2021-02-22 ENCOUNTER — Other Ambulatory Visit: Payer: Self-pay

## 2021-02-22 ENCOUNTER — Encounter: Payer: Self-pay | Admitting: Family Medicine

## 2021-02-22 ENCOUNTER — Ambulatory Visit: Payer: 59 | Admitting: Family Medicine

## 2021-02-22 VITALS — BP 124/75 | HR 101 | Temp 97.3°F | Resp 19 | Ht 68.0 in | Wt 201.4 lb

## 2021-02-22 DIAGNOSIS — E1169 Type 2 diabetes mellitus with other specified complication: Secondary | ICD-10-CM | POA: Diagnosis not present

## 2021-02-22 DIAGNOSIS — E119 Type 2 diabetes mellitus without complications: Secondary | ICD-10-CM

## 2021-02-22 DIAGNOSIS — E785 Hyperlipidemia, unspecified: Secondary | ICD-10-CM | POA: Diagnosis not present

## 2021-02-22 LAB — LIPID PANEL
Cholesterol: 189 mg/dL (ref 0–200)
HDL: 39.5 mg/dL (ref >39.00)
NonHDL: 149.04
Total CHOL/HDL Ratio: 5
Triglycerides: 235 mg/dL — ABNORMAL HIGH (ref 0.0–149.0)
VLDL: 47 mg/dL — ABNORMAL HIGH (ref 0.0–40.0)

## 2021-02-22 LAB — BASIC METABOLIC PANEL
BUN: 24 mg/dL — ABNORMAL HIGH (ref 6–23)
CO2: 28 mEq/L (ref 19–32)
Calcium: 10.1 mg/dL (ref 8.4–10.5)
Chloride: 100 mEq/L (ref 96–112)
Creatinine, Ser: 0.56 mg/dL (ref 0.40–1.20)
GFR: 98.51 mL/min (ref >60.00)
Glucose, Bld: 127 mg/dL — ABNORMAL HIGH (ref 70–99)
Potassium: 4 mEq/L (ref 3.5–5.1)
Sodium: 139 mEq/L (ref 135–145)

## 2021-02-22 LAB — CBC WITH DIFFERENTIAL/PLATELET
Basophils Absolute: 0 10*3/uL (ref 0.0–0.1)
Basophils Relative: 0.4 % (ref 0.0–3.0)
Eosinophils Absolute: 0.2 10*3/uL (ref 0.0–0.7)
Eosinophils Relative: 2.4 % (ref 0.0–5.0)
HCT: 39.5 % (ref 36.0–46.0)
Hemoglobin: 13.6 g/dL (ref 12.0–15.0)
Lymphocytes Relative: 26.9 % (ref 12.0–46.0)
Lymphs Abs: 2.4 10*3/uL (ref 0.7–4.0)
MCHC: 34.3 g/dL (ref 30.0–36.0)
MCV: 87.5 fl (ref 78.0–100.0)
Monocytes Absolute: 0.5 10*3/uL (ref 0.1–1.0)
Monocytes Relative: 5.8 % (ref 3.0–12.0)
Neutro Abs: 5.7 10*3/uL (ref 1.4–7.7)
Neutrophils Relative %: 64.5 % (ref 43.0–77.0)
Platelets: 347 10*3/uL (ref 150.0–400.0)
RBC: 4.51 Mil/uL (ref 3.87–5.11)
RDW: 13 % (ref 11.5–15.5)
WBC: 8.8 10*3/uL (ref 4.0–10.5)

## 2021-02-22 LAB — HEPATIC FUNCTION PANEL
ALT: 25 U/L (ref 0–35)
AST: 20 U/L (ref 0–37)
Albumin: 4.4 g/dL (ref 3.5–5.2)
Alkaline Phosphatase: 78 U/L (ref 39–117)
Bilirubin, Direct: 0.1 mg/dL (ref 0.0–0.3)
Total Bilirubin: 0.7 mg/dL (ref 0.2–1.2)
Total Protein: 7.1 g/dL (ref 6.0–8.3)

## 2021-02-22 LAB — TSH: TSH: 0.51 u[IU]/mL (ref 0.35–4.50)

## 2021-02-22 LAB — LDL CHOLESTEROL, DIRECT: Direct LDL: 114 mg/dL

## 2021-02-22 LAB — HEMOGLOBIN A1C: Hgb A1c MFr Bld: 6.9 % — ABNORMAL HIGH (ref 4.6–6.5)

## 2021-02-22 NOTE — Patient Instructions (Signed)
Schedule your complete physical in 3-4 months We'll notify you of your lab results and make any changes if needed Continue to work on healthy diet and regular exercise- you're doing great!! Call with any questions or concerns I'M. SO. PROUD. OF. YOU.

## 2021-02-22 NOTE — Assessment & Plan Note (Signed)
Chronic problem.  On Lipitor 40mg daily.  Check labs.  Adjust meds prn  

## 2021-02-22 NOTE — Progress Notes (Signed)
   Subjective:    Patient ID: Catherine Castillo, female    DOB: 09/12/1961, 60 y.o.   MRN: [id number]  HPI DM- chronic problem, on Janumet 50/500mg  BID and Trulicity 1.5mg  weekly.  On ACE for renal protection.  Has eye exam later today.  UTD on foot exam.  No CP, SOB, HAs, visual changes, abd pain, N/V.  2 symptomatic lows when she forgot to eat.  No numbness/tingling of hands/feet.  Hyperlipidemia- chronic problem, on Lipitor 40mg  daily.  Denies abd pain, N/V  Obesity- down 17 lbs since last visit and 32 lbs overall!  'I feel good!'.  Eating low carb diet, avoiding processed food and playing 'lots of pickle ball'.     Review of Systems For ROS see HPI   This visit occurred during the SARS-CoV-2 public health emergency.  Safety protocols were in place, including screening questions prior to the visit, additional usage of staff PPE, and extensive cleaning of exam room while observing appropriate contact time as indicated for disinfecting solutions.       Objective:   Physical Exam Vitals reviewed.  Constitutional:      General: She is not in acute distress.    Appearance: Normal appearance. She is well-developed. She is not ill-appearing.  HENT:     Head: Normocephalic and atraumatic.  Eyes:     Conjunctiva/sclera: Conjunctivae normal.     Pupils: Pupils are equal, round, and reactive to light.  Neck:     Thyroid: No thyromegaly.  Cardiovascular:     Rate and Rhythm: Normal rate and regular rhythm.     Pulses: Normal pulses.     Heart sounds: Normal heart sounds. No murmur heard.   Pulmonary:     Effort: Pulmonary effort is normal. No respiratory distress.     Breath sounds: Normal breath sounds.  Abdominal:     General: There is no distension.     Palpations: Abdomen is soft.     Tenderness: There is no abdominal tenderness.  Musculoskeletal:     Cervical back: Normal range of motion and neck supple.     Right lower leg: No edema.     Left lower leg: No edema.   Lymphadenopathy:     Cervical: No cervical adenopathy.  Skin:    General: Skin is warm and dry.  Neurological:     Mental Status: She is alert and oriented to person, place, and time.  Psychiatric:        Behavior: Behavior normal.           Assessment & Plan:

## 2021-02-22 NOTE — Assessment & Plan Note (Signed)
Pt is down 32 lbs!  She is eating low carb, avoiding processed foods, and exercising regularly.  I am thrilled with her progress and she is no longer morbidly obese!  Will continue to follow.

## 2021-02-22 NOTE — Assessment & Plan Note (Signed)
Chronic problem.  Pt is down 32 lbs since she started being mindful of diet and exercise.  Currently on Janumet 50/500mg  BID and Trulicity weekly.  On ACE for renal protection.  Eye exam this afternoon.  UTD on foot exam.  Applauded her efforts.  Check labs.  Adjust meds prn

## 2021-02-24 ENCOUNTER — Encounter: Payer: Self-pay | Admitting: Family Medicine

## 2021-02-24 ENCOUNTER — Other Ambulatory Visit: Payer: Self-pay

## 2021-02-24 DIAGNOSIS — L989 Disorder of the skin and subcutaneous tissue, unspecified: Secondary | ICD-10-CM

## 2021-02-26 ENCOUNTER — Other Ambulatory Visit: Payer: Self-pay | Admitting: Family Medicine

## 2021-03-05 ENCOUNTER — Other Ambulatory Visit: Payer: Self-pay | Admitting: Family Medicine

## 2021-03-05 DIAGNOSIS — E119 Type 2 diabetes mellitus without complications: Secondary | ICD-10-CM

## 2021-03-16 ENCOUNTER — Other Ambulatory Visit: Payer: Self-pay | Admitting: Family Medicine

## 2021-03-26 LAB — HM DIABETES EYE EXAM

## 2021-04-14 ENCOUNTER — Encounter: Payer: Self-pay | Admitting: *Deleted

## 2021-06-13 ENCOUNTER — Other Ambulatory Visit: Payer: Self-pay | Admitting: Family Medicine

## 2021-06-29 ENCOUNTER — Ambulatory Visit: Payer: 59 | Admitting: Family Medicine

## 2021-07-22 ENCOUNTER — Other Ambulatory Visit: Payer: Self-pay

## 2021-07-22 DIAGNOSIS — E119 Type 2 diabetes mellitus without complications: Secondary | ICD-10-CM

## 2021-07-22 MED ORDER — TRULICITY 1.5 MG/0.5ML ~~LOC~~ SOAJ: SUBCUTANEOUS | 3 refills | Status: DC

## 2021-07-28 ENCOUNTER — Other Ambulatory Visit: Payer: Self-pay | Admitting: Family Medicine

## 2021-08-09 ENCOUNTER — Encounter: Payer: Self-pay | Admitting: Family Medicine

## 2021-09-11 ENCOUNTER — Other Ambulatory Visit: Payer: Self-pay | Admitting: Family Medicine

## 2021-09-28 ENCOUNTER — Encounter: Payer: Self-pay | Admitting: Family Medicine

## 2021-09-28 ENCOUNTER — Ambulatory Visit: Payer: 59 | Admitting: Family Medicine

## 2021-09-28 VITALS — BP 126/74 | HR 96 | Temp 97.5°F | Resp 16 | Wt 197.8 lb

## 2021-09-28 DIAGNOSIS — E119 Type 2 diabetes mellitus without complications: Secondary | ICD-10-CM | POA: Diagnosis not present

## 2021-09-28 DIAGNOSIS — I10 Essential (primary) hypertension: Secondary | ICD-10-CM | POA: Diagnosis not present

## 2021-09-28 DIAGNOSIS — E785 Hyperlipidemia, unspecified: Secondary | ICD-10-CM | POA: Diagnosis not present

## 2021-09-28 DIAGNOSIS — E1169 Type 2 diabetes mellitus with other specified complication: Secondary | ICD-10-CM

## 2021-09-28 LAB — CBC WITH DIFFERENTIAL/PLATELET
Basophils Absolute: 0 10*3/uL (ref 0.0–0.1)
Basophils Relative: 0.4 % (ref 0.0–3.0)
Eosinophils Absolute: 0.2 10*3/uL (ref 0.0–0.7)
Eosinophils Relative: 2.7 % (ref 0.0–5.0)
HCT: 39.6 % (ref 36.0–46.0)
Hemoglobin: 13.3 g/dL (ref 12.0–15.0)
Lymphocytes Relative: 27.9 % (ref 12.0–46.0)
Lymphs Abs: 1.9 10*3/uL (ref 0.7–4.0)
MCHC: 33.5 g/dL (ref 30.0–36.0)
MCV: 89.5 fl (ref 78.0–100.0)
Monocytes Absolute: 0.4 10*3/uL (ref 0.1–1.0)
Monocytes Relative: 6 % (ref 3.0–12.0)
Neutro Abs: 4.3 10*3/uL (ref 1.4–7.7)
Neutrophils Relative %: 63 % (ref 43.0–77.0)
Platelets: 323 10*3/uL (ref 150.0–400.0)
RBC: 4.42 Mil/uL (ref 3.87–5.11)
RDW: 13.7 % (ref 11.5–15.5)
WBC: 6.8 10*3/uL (ref 4.0–10.5)

## 2021-09-28 LAB — LIPID PANEL
Cholesterol: 185 mg/dL (ref 0–200)
HDL: 43.2 mg/dL (ref >39.00)
LDL Cholesterol: 115 mg/dL — ABNORMAL HIGH (ref 0–99)
NonHDL: 142.19
Total CHOL/HDL Ratio: 4
Triglycerides: 134 mg/dL (ref 0.0–149.0)
VLDL: 26.8 mg/dL (ref 0.0–40.0)

## 2021-09-28 LAB — BASIC METABOLIC PANEL
BUN: 20 mg/dL (ref 6–23)
CO2: 29 mEq/L (ref 19–32)
Calcium: 10.2 mg/dL (ref 8.4–10.5)
Chloride: 99 mEq/L (ref 96–112)
Creatinine, Ser: 0.59 mg/dL (ref 0.40–1.20)
GFR: 96.87 mL/min (ref >60.00)
Glucose, Bld: 91 mg/dL (ref 70–99)
Potassium: 4.2 mEq/L (ref 3.5–5.1)
Sodium: 137 mEq/L (ref 135–145)

## 2021-09-28 LAB — HEPATIC FUNCTION PANEL
ALT: 19 U/L (ref 0–35)
AST: 18 U/L (ref 0–37)
Albumin: 4.5 g/dL (ref 3.5–5.2)
Alkaline Phosphatase: 72 U/L (ref 39–117)
Bilirubin, Direct: 0.1 mg/dL (ref 0.0–0.3)
Total Bilirubin: 0.7 mg/dL (ref 0.2–1.2)
Total Protein: 7.2 g/dL (ref 6.0–8.3)

## 2021-09-28 LAB — TSH: TSH: 1.1 u[IU]/mL (ref 0.35–5.50)

## 2021-09-28 LAB — HEMOGLOBIN A1C: Hgb A1c MFr Bld: 6.5 % (ref 4.6–6.5)

## 2021-09-28 MED ORDER — LISINOPRIL 20 MG PO TABS: 20.0000 mg | ORAL_TABLET | Freq: Every day | ORAL | 1 refills | Status: DC

## 2021-09-28 MED ORDER — LISINOPRIL 20 MG PO TABS: 20.0000 mg | ORAL_TABLET | Freq: Every day | ORAL | 0 refills | Status: DC

## 2021-09-28 NOTE — Assessment & Plan Note (Signed)
Chronic problem.  Last A1C was well controlled on Janumet and Trulicity.  UTD on eye exam.  Declines foot exam today b/c she played 2.5 hrs of pickle ball.  Check labs.  Adjust meds prn

## 2021-09-28 NOTE — Progress Notes (Signed)
° °  Subjective:    Patient ID: Catherine Castillo, female    DOB: 1961-08-29, 60 y.o.   MRN: [id number]  HPI DM- chronic problem, on Janumet 50/500mg  daily, Trulicity 1.5mg  weekly w/ hx of good control.  UTD on eye exam.  Due for foot exam (declines today).  Due for A1C.  Denies symptomatic lows.  Denies numbness/tingling of hands/feet.  HTN- chronic problem, on Lisinopril 20mg  w/ good control.  No CP, SOB, HAs, visual changes, edema.  Hyperlipidemia- chronic problem, on Lipitor 40mg  daily.  No abd pain, N/V   Review of Systems For ROS see HPI   This visit occurred during the SARS-CoV-2 public health emergency.  Safety protocols were in place, including screening questions prior to the visit, additional usage of staff PPE, and extensive cleaning of exam room while observing appropriate contact time as indicated for disinfecting solutions.      Objective:   Physical Exam Vitals reviewed.  Constitutional:      General: She is not in acute distress.    Appearance: Normal appearance. She is well-developed. She is obese. She is not ill-appearing.  HENT:     Head: Normocephalic and atraumatic.  Eyes:     Conjunctiva/sclera: Conjunctivae normal.     Pupils: Pupils are equal, round, and reactive to light.  Neck:     Thyroid: No thyromegaly.  Cardiovascular:     Rate and Rhythm: Normal rate and regular rhythm.     Pulses: Normal pulses.     Heart sounds: Normal heart sounds. No murmur heard. Pulmonary:     Effort: Pulmonary effort is normal. No respiratory distress.     Breath sounds: Normal breath sounds.  Abdominal:     General: There is no distension.     Palpations: Abdomen is soft.     Tenderness: There is no abdominal tenderness.  Musculoskeletal:     Cervical back: Normal range of motion and neck supple.     Right lower leg: No edema.     Left lower leg: No edema.  Lymphadenopathy:     Cervical: No cervical adenopathy.  Skin:    General: Skin is warm and dry.   Neurological:     Mental Status: She is alert and oriented to person, place, and time.  Psychiatric:        Behavior: Behavior normal.          Assessment & Plan:

## 2021-09-28 NOTE — Assessment & Plan Note (Signed)
Chronic problem.  Well controlled today on Lisinopril 20mg  daily.  Asymptomatic.  Check labs due to ACE.  No anticipated med changes.

## 2021-09-28 NOTE — Patient Instructions (Signed)
Schedule your complete physical in 6 months We'll notify you of your lab results and make any changes if needed Call and schedule your pap at your convenience Continue to work on healthy diet and regular exercise- you're doing great! Call with any questions or concerns Stay Safe!  Stay Healthy! Happy Holidays!!

## 2021-09-28 NOTE — Assessment & Plan Note (Signed)
Chronic problem.  Tolerating statin w/o difficulty.  Check labs.  Adjust meds prn  

## 2021-11-11 ENCOUNTER — Other Ambulatory Visit: Payer: Self-pay | Admitting: Family Medicine

## 2021-11-11 DIAGNOSIS — E119 Type 2 diabetes mellitus without complications: Secondary | ICD-10-CM

## 2021-11-15 ENCOUNTER — Other Ambulatory Visit: Payer: Self-pay | Admitting: Family Medicine

## 2021-12-04 ENCOUNTER — Other Ambulatory Visit: Payer: Self-pay | Admitting: Family Medicine

## 2022-02-07 LAB — HM MAMMOGRAPHY

## 2022-02-16 ENCOUNTER — Encounter: Payer: Self-pay | Admitting: Family Medicine

## 2022-02-23 LAB — HM DEXA SCAN: HM Dexa Scan: NORMAL

## 2022-02-23 LAB — HM MAMMOGRAPHY

## 2022-03-04 ENCOUNTER — Other Ambulatory Visit: Payer: Self-pay | Admitting: Family Medicine

## 2022-03-07 ENCOUNTER — Other Ambulatory Visit: Payer: Self-pay

## 2022-03-07 DIAGNOSIS — E119 Type 2 diabetes mellitus without complications: Secondary | ICD-10-CM

## 2022-03-07 MED ORDER — TRULICITY 1.5 MG/0.5ML ~~LOC~~ SOAJ: SUBCUTANEOUS | 3 refills | Status: DC

## 2022-03-25 ENCOUNTER — Other Ambulatory Visit: Payer: Self-pay | Admitting: Family Medicine

## 2022-03-29 ENCOUNTER — Ambulatory Visit (INDEPENDENT_AMBULATORY_CARE_PROVIDER_SITE_OTHER): Payer: 59 | Admitting: Family Medicine

## 2022-03-29 ENCOUNTER — Encounter: Payer: Self-pay | Admitting: Family Medicine

## 2022-03-29 VITALS — BP 132/80 | HR 87 | Temp 98.3°F | Resp 16 | Ht 68.0 in | Wt 204.2 lb

## 2022-03-29 DIAGNOSIS — E119 Type 2 diabetes mellitus without complications: Secondary | ICD-10-CM

## 2022-03-29 DIAGNOSIS — Z0000 Encounter for general adult medical examination without abnormal findings: Secondary | ICD-10-CM | POA: Diagnosis not present

## 2022-03-29 LAB — LIPID PANEL
Cholesterol: 175 mg/dL (ref 0–200)
HDL: 40.9 mg/dL (ref >39.00)
LDL Cholesterol: 99 mg/dL (ref 0–99)
NonHDL: 134.45
Total CHOL/HDL Ratio: 4
Triglycerides: 177 mg/dL — ABNORMAL HIGH (ref 0.0–149.0)
VLDL: 35.4 mg/dL (ref 0.0–40.0)

## 2022-03-29 LAB — HEPATIC FUNCTION PANEL
ALT: 20 U/L (ref 0–35)
AST: 19 U/L (ref 0–37)
Albumin: 4.4 g/dL (ref 3.5–5.2)
Alkaline Phosphatase: 78 U/L (ref 39–117)
Bilirubin, Direct: 0.1 mg/dL (ref 0.0–0.3)
Total Bilirubin: 0.6 mg/dL (ref 0.2–1.2)
Total Protein: 7.3 g/dL (ref 6.0–8.3)

## 2022-03-29 LAB — BASIC METABOLIC PANEL
BUN: 17 mg/dL (ref 6–23)
CO2: 30 mEq/L (ref 19–32)
Calcium: 10.2 mg/dL (ref 8.4–10.5)
Chloride: 100 mEq/L (ref 96–112)
Creatinine, Ser: 0.66 mg/dL (ref 0.40–1.20)
GFR: 93.96 mL/min (ref >60.00)
Glucose, Bld: 129 mg/dL — ABNORMAL HIGH (ref 70–99)
Potassium: 4.6 mEq/L (ref 3.5–5.1)
Sodium: 137 mEq/L (ref 135–145)

## 2022-03-29 LAB — CBC WITH DIFFERENTIAL/PLATELET
Basophils Absolute: 0 10*3/uL (ref 0.0–0.1)
Basophils Relative: 0.5 % (ref 0.0–3.0)
Eosinophils Absolute: 0.2 10*3/uL (ref 0.0–0.7)
Eosinophils Relative: 3.7 % (ref 0.0–5.0)
HCT: 40.3 % (ref 36.0–46.0)
Hemoglobin: 13.6 g/dL (ref 12.0–15.0)
Lymphocytes Relative: 26.1 % (ref 12.0–46.0)
Lymphs Abs: 1.7 10*3/uL (ref 0.7–4.0)
MCHC: 33.7 g/dL (ref 30.0–36.0)
MCV: 89.2 fl (ref 78.0–100.0)
Monocytes Absolute: 0.5 10*3/uL (ref 0.1–1.0)
Monocytes Relative: 7.7 % (ref 3.0–12.0)
Neutro Abs: 4 10*3/uL (ref 1.4–7.7)
Neutrophils Relative %: 62 % (ref 43.0–77.0)
Platelets: 312 10*3/uL (ref 150.0–400.0)
RBC: 4.52 Mil/uL (ref 3.87–5.11)
RDW: 12.8 % (ref 11.5–15.5)
WBC: 6.4 10*3/uL (ref 4.0–10.5)

## 2022-03-29 LAB — TSH: TSH: 0.77 u[IU]/mL (ref 0.35–5.50)

## 2022-03-29 LAB — HEMOGLOBIN A1C: Hgb A1c MFr Bld: 6.6 % — ABNORMAL HIGH (ref 4.6–6.5)

## 2022-03-29 NOTE — Assessment & Plan Note (Signed)
Chronic problem.  Foot exam done today, UTD on microalbumin.  Eye exam scheduled for this week.  Check labs.  Adjust meds prn

## 2022-03-29 NOTE — Assessment & Plan Note (Signed)
Pt's PE WNL w/ exception of BMI.  UTD on mammo, Tdap.  Pt to call GI and schedule repeat colonoscopy.  Will call GYN to reschedule pap.  Check labs.  Anticipatory guidance provided.

## 2022-03-29 NOTE — Patient Instructions (Signed)
Follow up in 6 months to recheck BP, cholesterol, and DM We'll notify you of your lab results and make any changes if needed Keep up the good work on healthy diet and regular exercise- you look great!! Call Dr Collene Mares and ask about your colonoscopy Schedule pap w/ Dr Mancel Bale Call any questions or concerns Stay safe!  Stay Healthy! Have a great summer!!!

## 2022-03-29 NOTE — Progress Notes (Signed)
   Subjective:    Patient ID: Catherine Castillo, female    DOB: 1960/11/02, 60 y.o.   MRN: [id number]  HPI CPE- due for colonoscopy (Dr Collene Mares), pap Mancel Bale- had to cancel appt, needs to reschedule).  UTD on mammo, Tdap.  Due for eye exam- scheduled for Thursday.  Foot exam done today.  Patient Care Team    Relationship Specialty Notifications Start End  Midge Minium, MD PCP - General   09/22/10   Marica Otter, [place] [facility] Optometry  02/06/15     Health Maintenance  Topic Date Due   PAP SMEAR-Modifier  01/27/2020   COLONOSCOPY (Pts 45-21yr Insurance coverage will need to be confirmed)  03/10/2022   OPHTHALMOLOGY EXAM  03/26/2022   HEMOGLOBIN A1C  03/29/2022   Zoster Vaccines- Shingrix (2 of 2) 04/20/2022   INFLUENZA VACCINE  05/17/2022   MAMMOGRAM  02/24/2023   FOOT EXAM  03/30/2023   TETANUS/TDAP  04/18/2025   COVID-19 Vaccine  Completed   Hepatitis C Screening  Completed   HPV VACCINES  Aged Out   HIV Screening  Discontinued      Review of Systems Patient reports no vision changes, adenopathy,fever, weight change,  persistant/recurrent hoarseness , swallowing issues, chest pain, palpitations, edema, persistant/recurrent cough, hemoptysis, dyspnea (rest/exertional/paroxysmal nocturnal), gastrointestinal bleeding (melena, rectal bleeding), abdominal pain, significant heartburn, bowel changes, GU symptoms (dysuria, hematuria, incontinence), Gyn symptoms (abnormal  bleeding, pain),  syncope, focal weakness, memory loss, numbness & tingling, skin/hair/nail changes, abnormal bruising or bleeding, anxiety, or depression.   + hearing loss    Objective:   Physical Exam General Appearance:    Alert, cooperative, no distress, appears stated age  Head:    Normocephalic, without obvious abnormality, atraumatic  Eyes:    PERRL, conjunctiva/corneas clear, EOM's intact both eyes  Ears:    Normal TM's and external ear canals, both ears  Nose:   Nares normal, septum midline,  mucosa normal, no drainage    or sinus tenderness  Throat:   Lips, mucosa, and tongue normal; teeth and gums normal  Neck:   Supple, symmetrical, trachea midline, no adenopathy;    Thyroid: no enlargement/tenderness/nodules  Back:     Symmetric, no curvature, ROM normal, no CVA tenderness  Lungs:     Clear to auscultation bilaterally, respirations unlabored  Chest Wall:    No tenderness or deformity   Heart:    Regular rate and rhythm, S1 and S2 normal, no murmur, rub   or gallop  Breast Exam:    Deferred to mammo  Abdomen:     Soft, non-tender, bowel sounds active all four quadrants,    no masses, no organomegaly  Genitalia:    Deferred to GYN  Rectal:    Extremities:   Extremities normal, atraumatic, no cyanosis or edema  Pulses:   2+ and symmetric all extremities  Skin:   Skin color, texture, turgor normal, no rashes or lesions  Lymph nodes:   Cervical, supraclavicular, and axillary nodes normal  Neurologic:   CNII-XII intact, normal strength, sensation and reflexes    throughout          Assessment & Plan:

## 2022-03-30 NOTE — Progress Notes (Signed)
Results seen by pt Via my chart

## 2022-03-31 LAB — HM DIABETES EYE EXAM

## 2022-04-30 ENCOUNTER — Other Ambulatory Visit: Payer: Self-pay | Admitting: Family Medicine

## 2022-05-18 ENCOUNTER — Encounter: Payer: Self-pay | Admitting: Family Medicine

## 2022-05-18 LAB — HM COLONOSCOPY

## 2022-05-27 ENCOUNTER — Telehealth: Payer: 59 | Admitting: Family Medicine

## 2022-05-27 ENCOUNTER — Encounter: Payer: Self-pay | Admitting: Family Medicine

## 2022-06-02 ENCOUNTER — Other Ambulatory Visit: Payer: Self-pay | Admitting: Family Medicine

## 2022-07-01 ENCOUNTER — Other Ambulatory Visit: Payer: Self-pay

## 2022-07-01 DIAGNOSIS — E119 Type 2 diabetes mellitus without complications: Secondary | ICD-10-CM

## 2022-07-01 MED ORDER — TRULICITY 1.5 MG/0.5ML ~~LOC~~ SOAJ: SUBCUTANEOUS | 3 refills | Status: DC

## 2022-07-21 ENCOUNTER — Other Ambulatory Visit: Payer: Self-pay | Admitting: Gastroenterology

## 2022-07-22 ENCOUNTER — Encounter (HOSPITAL_COMMUNITY): Payer: Self-pay | Admitting: Gastroenterology

## 2022-07-22 NOTE — Progress Notes (Signed)
Attempted to obtain medical history via telephone, unable to reach at this time. HIPAA compliant voicemail message left requesting return call to pre surgical testing department. 

## 2022-07-29 ENCOUNTER — Encounter (HOSPITAL_COMMUNITY)
Admission: RE | Disposition: A | Discharge status: Home / Self Care | Payer: Self-pay | Source: Home / Self Care | Attending: Gastroenterology

## 2022-07-29 ENCOUNTER — Ambulatory Visit (HOSPITAL_BASED_OUTPATIENT_CLINIC_OR_DEPARTMENT_OTHER): Payer: 59 | Admitting: Anesthesiology

## 2022-07-29 ENCOUNTER — Ambulatory Visit (HOSPITAL_COMMUNITY)
Admission: RE | Admit: 2022-07-29 | Discharge: 2022-07-29 | Disposition: A | Discharge status: Home / Self Care | Payer: 59 | Attending: Gastroenterology | Admitting: Gastroenterology

## 2022-07-29 ENCOUNTER — Encounter (HOSPITAL_COMMUNITY): Payer: Self-pay | Admitting: Gastroenterology

## 2022-07-29 ENCOUNTER — Ambulatory Visit (HOSPITAL_COMMUNITY): Payer: 59 | Admitting: Anesthesiology

## 2022-07-29 ENCOUNTER — Other Ambulatory Visit: Payer: Self-pay

## 2022-07-29 DIAGNOSIS — Z7984 Long term (current) use of oral hypoglycemic drugs: Secondary | ICD-10-CM | POA: Diagnosis not present

## 2022-07-29 DIAGNOSIS — I10 Essential (primary) hypertension: Secondary | ICD-10-CM | POA: Insufficient documentation

## 2022-07-29 DIAGNOSIS — E039 Hypothyroidism, unspecified: Secondary | ICD-10-CM

## 2022-07-29 DIAGNOSIS — Z6830 Body mass index (BMI) 30.0-30.9, adult: Secondary | ICD-10-CM | POA: Insufficient documentation

## 2022-07-29 DIAGNOSIS — K635 Polyp of colon: Secondary | ICD-10-CM | POA: Insufficient documentation

## 2022-07-29 DIAGNOSIS — Z87891 Personal history of nicotine dependence: Secondary | ICD-10-CM | POA: Insufficient documentation

## 2022-07-29 DIAGNOSIS — D126 Benign neoplasm of colon, unspecified: Secondary | ICD-10-CM | POA: Diagnosis not present

## 2022-07-29 DIAGNOSIS — E109 Type 1 diabetes mellitus without complications: Secondary | ICD-10-CM | POA: Insufficient documentation

## 2022-07-29 HISTORY — PX: FLEXIBLE SIGMOIDOSCOPY: SHX5431

## 2022-07-29 LAB — GLUCOSE, CAPILLARY: Glucose-Capillary: 164 mg/dL — ABNORMAL HIGH (ref 70–99)

## 2022-07-29 SURGERY — SIGMOIDOSCOPY, FLEXIBLE
Anesthesia: Monitor Anesthesia Care

## 2022-07-29 MED ORDER — PROPOFOL 500 MG/50ML IV EMUL
INTRAVENOUS | Status: DC | PRN
  Administered 2022-07-29: 135 ug/kg/min via INTRAVENOUS

## 2022-07-29 MED ORDER — LACTATED RINGERS IV SOLN
INTRAVENOUS | Status: DC
  Administered 2022-07-29: 1000 mL via INTRAVENOUS

## 2022-07-29 MED ORDER — PROPOFOL 10 MG/ML IV BOLUS
INTRAVENOUS | Status: DC | PRN
  Administered 2022-07-29: 40 mg via INTRAVENOUS

## 2022-07-29 MED ORDER — LIDOCAINE HCL (CARDIAC) PF 100 MG/5ML IV SOSY
PREFILLED_SYRINGE | INTRAVENOUS | Status: DC | PRN
  Administered 2022-07-29: 100 mg via INTRAVENOUS

## 2022-07-29 MED ORDER — SODIUM CHLORIDE 0.9 % IV SOLN: INTRAVENOUS | Status: DC

## 2022-07-29 SURGICAL SUPPLY — 22 items

## 2022-07-29 NOTE — Transfer of Care (Signed)
Immediate Anesthesia Transfer of Care Note  Patient: Catherine Castillo  Procedure(s) Performed: Procedure(s): FLEXIBLE SIGMOIDOSCOPY (N/A)  Patient Location: PACU  Anesthesia Type:MAC  Level of Consciousness:  sedated, patient cooperative and responds to stimulation  Airway & Oxygen Therapy:Patient Spontanous Breathing and Patient connected to face mask oxgen  Post-op Assessment:  Report given to PACU RN and Post -op Vital signs reviewed and stable  Post vital signs:  Reviewed and stable  Last Vitals:  Vitals:   07/29/22 0734  BP: (!) 168/81  Resp: 20  Temp: 36.5 C  SpO2: 07%    Complications: No apparent anesthesia complications

## 2022-07-29 NOTE — H&P (Signed)
Catherine Castillo HPI: During her August screening colonoscopy the patient was found to have a large ascending colon polyp.  She is here today for resection.  Past Medical History:  Diagnosis Date   Arthritis    "knees" (08/27/2015)   Cancer of left breast ([place])    Colon polyps    Diverticulitis    Endometrial mass 05/07/10   Graves disease    "zapped in 1996"   H/O measles    H/O menorrhagia 05/07/10   History of chicken pox    History of diverticulitis of colon 07/03/10   Hyperlipidemia    Hypertension    Hypothyroidism    Migraine    "once q couple years now" (08/27/2015)   Peritonitis ([place]) 07/03/10   Type II diabetes mellitus ([place])     Past Surgical History:  Procedure Laterality Date   ABCESS DRAINAGE  10/2009   placement of percutaneous drain and intraabdominal abscess/notes 11/04/2009   APPENDECTOMY  07/03/2010   BOWEL RESECTION N/A 08/27/2015   Procedure: SMALL BOWEL RESECTION;  Surgeon: Erroll Luna, MD;  Location: [place];  Service: General;  Laterality: N/A;   BREAST LUMPECTOMY WITH NEEDLE LOCALIZATION AND AXILLARY LYMPH NODE DISSECTION Left 07/2009   Archie Endo 07/22/2009   COLECTOMY WITH COLOSTOMY CREATION/HARTMANN PROCEDURE  07/03/2010   Archie Endo 07/04/2010   COLOSTOMY TAKEDOWN  09/2010   Archie Endo 10/05/2010   COMBINED HYSTEROSCOPY DIAGNOSTIC / D&C  05/08/2010   COMBINED HYSTEROSCOPY DIAGNOSTIC / D&C  05/2012   Archie Endo 05/29/2012   HERNIA REPAIR     INCISIONAL HERNIA REPAIR  08/27/2015   open   INCISIONAL HERNIA REPAIR N/A 08/27/2015   Procedure: LAPAROSCOPIC INCISIONAL HERNIA REPAIR CONVERTED TO OPEN;  Surgeon: Erroll Luna, MD;  Location: [place];  Service: General;  Laterality: N/A;   LAPAROSCOPIC SIGMOID COLECTOMY  12/2009   Archie Endo 01/06/2010; secondary to diverticulitis   LAPAROSCOPIC SIGMOID COLECTOMY     Archie Endo 01/06/2010   POLYPECTOMY  05/14/10   SMALL INTESTINE SURGERY  08/27/2015   TONSILLECTOMY     TOTAL KNEE ARTHROPLASTY Bilateral 2008    Family History   Problem Relation Age of Onset   Coronary artery disease Father    Hypertension Father    Diabetes Father    Cancer Father        lung   Coronary artery disease Maternal Grandfather    Diabetes Maternal Grandfather    Hypertension Maternal Grandfather    Hypertension Other        grandparents   Stroke Paternal Grandfather    Breast cancer Maternal Grandmother    Colon cancer Paternal Grandmother     Social History:  reports that she quit smoking about 14 years ago. Her smoking use included cigarettes. She has a 30.00 pack-year smoking history. She has never used smokeless tobacco. She reports current alcohol use. She reports that she does not use drugs.  Allergies:  Allergies  Allergen Reactions   Penicillins Hives    Has patient had a PCN reaction causing immediate rash, facial/tongue/throat swelling, SOB or lightheadedness with hypotension: No Has patient had a PCN reaction causing severe rash involving mucus membranes or skin necrosis: No Has patient had a PCN reaction that required hospitalization No Has patient had a PCN reaction occurring within the last 10 years: No If all of the above answers are "NO", then may proceed with Cephalosporin use.    Amoxicillin Hives and Rash   Penicillin G Rash    Medications: Scheduled: Continuous:  sodium chloride  lactated ringers 1,000 mL (07/29/22 0741)    No results found for this or any previous visit (from the past 24 hour(s)).   No results found.  ROS:  As stated above in the HPI otherwise negative.  Blood pressure (!) 168/81, temperature 97.7 F (36.5 C), temperature source Temporal, resp. rate 20, height '5\' 9"'$  (1.753 m), weight 93 kg, SpO2 92 %.    PE: Gen: NAD, Alert and Oriented HEENT:  [place]/AT, EOMI Neck: Supple, no LAD Lungs: CTA Bilaterally CV: RRR without M/G/R ABD: Soft, NTND, +BS Ext: No C/C/E  Assessment/Plan: 1) Ascending colon polyp - colonoscopy with polypectomy.  Lola Lofaro D 07/29/2022,  7:53 AM

## 2022-07-29 NOTE — Op Note (Signed)
[facility] Patient Name: Catherine Castillo Procedure Date: 07/29/2022 MRN: [id number] Attending MD: Carol Ada , MD Date of Birth: 1960/11/07 CSN: [id number] Age: 61 Admit Type: Outpatient Procedure:                Flexible Sigmoidoscopy Indications:              For therapy of adenomatous polyps in the colon Providers:                Carol Ada, MD, Carlyn Reichert, RN, William Dalton, Technician Referring MD:              Medicines:                Propofol per Anesthesia Complications:            No immediate complications. Estimated Blood Loss:     Estimated blood loss: none. Procedure:                Pre-Anesthesia Assessment:                           - Prior to the procedure, a History and Physical                            was performed, and patient medications and                            allergies were reviewed. The patient's tolerance of                            previous anesthesia was also reviewed. The risks                            and benefits of the procedure and the sedation                            options and risks were discussed with the patient.                            All questions were answered, and informed consent                            was obtained. Prior Anticoagulants: The patient has                            taken no previous anticoagulant or antiplatelet                            agents. ASA Grade Assessment: II - A patient with                            mild systemic disease. After reviewing the risks  and benefits, the patient was deemed in                            satisfactory condition to undergo the procedure.                           - Sedation was administered by an anesthesia                            professional. Deep sedation was attained.                           After obtaining informed consent, the scope was                            passed under  direct vision. The CF-[id number] ([id number])                            Olympus colonoscope was introduced through the anus                            and advanced to the the sigmoid colon. After                            obtaining informed consent, the scope was passed                            under direct vision. The flexible sigmoidoscopy was                            performed with difficulty due to poor bowel prep.                            The patient tolerated the procedure well. The                            quality of the bowel preparation was poor. Scope In: 8:17:31 AM Scope Out: 8:18:55 AM Total Procedure Duration: 0 hours 1 minute 24 seconds  Findings:      A large amount of semi-solid stool was found in the rectum, in the       recto-sigmoid colon and in the sigmoid colon, making visualization       difficult. The stool was not able to be cleared out with lavage. Impression:               - Preparation of the colon was poor.                           - Stool in the rectum, in the recto-sigmoid colon                            and in the sigmoid colon.                           - No  specimens collected. Moderate Sedation:      Not Applicable - Patient had care per Anesthesia. Recommendation:           - Patient has a contact number available for                            emergencies. The signs and symptoms of potential                            delayed complications were discussed with the                            patient. Return to normal activities tomorrow.                            Written discharge instructions were provided to the                            patient.                           - Resume previous diet.                           - Reschedule for the colonoscopy. Procedure Code(s):        --- Professional ---                           [phone], Sigmoidoscopy, flexible; diagnostic,                            including collection of specimen(s) by brushing  or                            washing, when performed (separate procedure) Diagnosis Code(s):        --- Professional ---                           D12.6, Benign neoplasm of colon, unspecified CPT copyright 2019 American Medical Association. All rights reserved. The codes documented in this report are preliminary and upon coder review may  be revised to meet current compliance requirements. Carol Ada, MD Carol Ada, MD 07/29/2022 8:27:39 AM This report has been signed electronically. Number of Addenda: 0

## 2022-07-29 NOTE — Anesthesia Postprocedure Evaluation (Signed)
Anesthesia Post Note  Patient: Catherine Castillo  Procedure(s) Performed: [place]     Patient location during evaluation: PACU Anesthesia Type: MAC Level of consciousness: awake and alert Pain management: pain level controlled Vital Signs Assessment: post-procedure vital signs reviewed and stable Respiratory status: spontaneous breathing, nonlabored ventilation, respiratory function stable and patient connected to nasal cannula oxygen Cardiovascular status: stable and blood pressure returned to baseline Postop Assessment: no apparent nausea or vomiting Anesthetic complications: no   No notable events documented.  Last Vitals:  Vitals:   07/29/22 0839 07/29/22 0840  BP: (!) 142/70 (!) 145/67  Pulse: 74 76  Resp: 14 13  Temp:    SpO2: 95% 96%    Last Pain:  Vitals:   07/29/22 0839  TempSrc:   PainSc: 0-No pain                 Raelin Pixler

## 2022-07-29 NOTE — Anesthesia Preprocedure Evaluation (Addendum)
Anesthesia Evaluation  Patient identified by MRN, date of birth, ID band Patient awake    Reviewed: Allergy & Precautions, NPO status , Patient's Chart, lab work & pertinent test results  Airway Mallampati: II  TM Distance: >3 FB Neck ROM: Full    Dental  (+) Dental Advisory Given, Teeth Intact, Caps   Pulmonary former smoker,    breath sounds clear to auscultation       Cardiovascular hypertension, Pt. on medications  Rhythm:Regular Rate:Normal     Neuro/Psych  Headaches,    GI/Hepatic   Endo/Other  diabetes, Type 1, Oral Hypoglycemic AgentsHypothyroidism Morbid obesity  Renal/GU      Musculoskeletal   Abdominal   Peds  Hematology   Anesthesia Other Findings   Reproductive/Obstetrics                            Anesthesia Physical Anesthesia Plan  ASA: 3  Anesthesia Plan: MAC   Post-op Pain Management: Minimal or no pain anticipated   Induction: Intravenous  PONV Risk Score and Plan: Treatment may vary due to age or medical condition and Propofol infusion  Airway Management Planned: Mask and Natural Airway  Additional Equipment: None  Intra-op Plan:   Post-operative Plan:   Informed Consent:   Plan Discussed with: Anesthesiologist  Anesthesia Plan Comments:         Anesthesia Quick Evaluation

## 2022-07-29 NOTE — Discharge Instructions (Signed)

## 2022-07-30 ENCOUNTER — Encounter: Payer: Self-pay | Admitting: Family Medicine

## 2022-07-30 ENCOUNTER — Encounter (HOSPITAL_COMMUNITY): Payer: Self-pay | Admitting: Gastroenterology

## 2022-07-30 DIAGNOSIS — N950 Postmenopausal bleeding: Secondary | ICD-10-CM

## 2022-08-12 ENCOUNTER — Other Ambulatory Visit: Payer: Self-pay | Admitting: Gastroenterology

## 2022-08-16 ENCOUNTER — Ambulatory Visit: Payer: 59 | Admitting: Radiology

## 2022-08-16 ENCOUNTER — Encounter: Payer: Self-pay | Admitting: Radiology

## 2022-08-16 VITALS — BP 158/94 | Ht 68.0 in | Wt 217.0 lb

## 2022-08-16 DIAGNOSIS — N950 Postmenopausal bleeding: Secondary | ICD-10-CM | POA: Diagnosis not present

## 2022-08-16 NOTE — Progress Notes (Signed)
   Catherine Castillo November 30, 1960 [id number]   History: Postmenopausal 61 y.o. refererred from PCP for PMB. She is s/p breast CA and Tamoxifen use x 5 years.Noticed first episode 6 weeks ago, only when wiping, no need for a pad. Hx of D&C x 2 for thickened endometrium while on tamoxifen therapy. Currently on Cipro for a UTI. Denies any pain or cramping.    Gynecologic History Postmenopausal Last Pap: 2018. Results were: normal Last mammogram: 02/23/22. Results were: normal Last colonoscopy: 05/18/22 HRT use: never  Obstetric History OB History  Gravida Para Term Preterm AB Living  0 0       0  SAB IAB Ectopic Multiple Live Births                The following portions of the patient's history were reviewed and updated as appropriate: allergies, current medications, past family history, past medical history, past social history, past surgical history, and problem list.  Review of Systems Pertinent items noted in HPI and remainder of comprehensive ROS otherwise negative.  Past medical history, past surgical history, family history and social history were all reviewed and documented in the EPIC chart.  Exam:  Vitals:   08/16/22 1012  BP: (!) 158/94  Weight: 217 lb (98.4 kg)  Height: '5\' 8"'$  (1.727 m)   Body mass index is 32.99 kg/m.  General appearance:  Normal Abdominal  Soft,nontender, without masses, guarding or rebound.  Liver/spleen:  No organomegaly noted  Hernia:  None appreciated Genitourinary   Inguinal/mons:  Normal without inguinal adenopathy  External genitalia:  Normal appearing vulva with no masses, tenderness, or lesions  BUS/Urethra/Skene's glands:  Normal  Vagina:  Normal appearing with normal color and discharge, no lesions. Atrophy: moderate   Cervix:  Normal appearing without discharge or lesions. Scant red blood  Uterus:  Normal in size, shape and contour.  Midline and mobile, nontender  Adnexa/parametria:     Rt: Normal in size, without masses or  tenderness.   Lt: Normal in size, without masses or tenderness.  Anus and perineum: Normal    Patient informed chaperone available to be present for breast and pelvic exam. Patient has requested no chaperone to be present. Patient has been advised what will be completed during breast and pelvic exam.   Assessment/Plan:   1. Post-menopausal bleeding  - US Transvaginal Non-OB; Future     Kerry Dory WHNP-BC, 10:29 AM 08/16/2022

## 2022-08-21 ENCOUNTER — Other Ambulatory Visit: Payer: Self-pay | Admitting: Family Medicine

## 2022-08-22 ENCOUNTER — Other Ambulatory Visit: Payer: Self-pay | Admitting: Family Medicine

## 2022-08-23 ENCOUNTER — Ambulatory Visit (INDEPENDENT_AMBULATORY_CARE_PROVIDER_SITE_OTHER): Payer: 59 | Admitting: Radiology

## 2022-08-23 ENCOUNTER — Encounter: Payer: Self-pay | Admitting: Radiology

## 2022-08-23 ENCOUNTER — Ambulatory Visit (INDEPENDENT_AMBULATORY_CARE_PROVIDER_SITE_OTHER): Payer: 59

## 2022-08-23 ENCOUNTER — Other Ambulatory Visit (HOSPITAL_COMMUNITY)
Admission: RE | Admit: 2022-08-23 | Discharge: 2022-08-23 | Disposition: A | Discharge status: Home / Self Care | Payer: 59 | Source: Ambulatory Visit | Attending: Radiology | Admitting: Radiology

## 2022-08-23 VITALS — BP 158/86

## 2022-08-23 VITALS — BP 156/80 | Ht 68.25 in | Wt 218.0 lb

## 2022-08-23 DIAGNOSIS — Z01419 Encounter for gynecological examination (general) (routine) without abnormal findings: Secondary | ICD-10-CM | POA: Diagnosis present

## 2022-08-23 DIAGNOSIS — N950 Postmenopausal bleeding: Secondary | ICD-10-CM | POA: Diagnosis not present

## 2022-08-23 DIAGNOSIS — R030 Elevated blood-pressure reading, without diagnosis of hypertension: Secondary | ICD-10-CM | POA: Diagnosis not present

## 2022-08-23 DIAGNOSIS — R9389 Abnormal findings on diagnostic imaging of other specified body structures: Secondary | ICD-10-CM | POA: Diagnosis not present

## 2022-08-23 MED ORDER — MISOPROSTOL 200 MCG PO TABS: 400.0000 ug | ORAL_TABLET | Freq: Once | ORAL | 0 refills | Status: DC

## 2022-08-23 MED ORDER — DIAZEPAM 5 MG PO TABS: 5.0000 mg | ORAL_TABLET | Freq: Once | ORAL | 0 refills | Status: AC

## 2022-08-23 NOTE — Progress Notes (Signed)
   Catherine Castillo 1961/07/21 [id number]   History:  61 y.o. G0 presents for annual exam. PMB, has u/s scheduled. 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  Gynecologic History No LMP recorded. Patient is postmenopausal.   Contraception/Family planning: post menopausal status Sexually active: yes Last Pap: 2018. Results were: normal Last mammogram: 02/23/22. Results were: normal  Obstetric History OB History  Gravida Para Term Preterm AB Living  0 0       0  SAB IAB Ectopic Multiple Live Births                The following portions of the patient's history were reviewed and updated as appropriate: allergies, current medications, past family history, past medical history, past social history, past surgical history, and problem list.  Review of Systems Pertinent items noted in HPI and remainder of comprehensive ROS otherwise negative.   Past medical history, past surgical history, family history and social history were all reviewed and documented in the EPIC chart.   Exam:  Vitals:   08/23/22 0753  BP: (!) 156/80  Weight: 218 lb (98.9 kg)  Height: 5' 8.25" (1.734 m)   Body mass index is 32.9 kg/m.  General appearance:  Normal Thyroid:  Symmetrical, normal in size, without palpable masses or nodularity. Respiratory  Auscultation:  Clear without wheezing or rhonchi Cardiovascular  Auscultation:  Regular rate, without rubs, murmurs or gallops  Edema/varicosities:  Not grossly evident Abdominal  Abdominal scar from bowel resection Soft,nontender, without masses, guarding or rebound.  Liver/spleen:  No organomegaly noted  Hernia:  None appreciated  Skin  Inspection:  Grossly normal Breasts: Examined lying and sitting.   Right: Without masses, retractions, nipple discharge or axillary adenopathy.   Left: Left lumpectomy scar. Without masses, retractions, nipple discharge or axillary adenopathy. Genitourinary   Inguinal/mons:  Normal without inguinal adenopathy  External  genitalia:  Normal appearing vulva with no masses, tenderness, or lesions  BUS/Urethra/Skene's glands:  Normal without masses or exudate  Vagina:  Normal appearing with normal color and discharge, no lesions  Cervix:  Normal appearing without discharge or lesions. Scant blood present.  Uterus:  Normal in size, shape and contour.  Mobile, nontender  Adnexa/parametria:     Rt: Normal in size, without masses or tenderness.   Lt: Normal in size, without masses or tenderness.  Anus and perineum: Normal   Patient informed chaperone available to be present for breast and pelvic exam. Patient has requested no chaperone to be present. Patient has been advised what will be completed during breast and pelvic exam.   Assessment/Plan:   1. Well woman exam with routine gynecological exam  - Cytology - PAP( [place])  2. Post-menopausal bleeding U/s scheduled    3. Elevated blood pressure reading Continue to monitor, f/u with PCP   Discussed SBE, colonoscopy and DEXA screening as directed/appropriate. Recommend 135mns of exercise weekly, including weight bearing exercise. Encouraged the use of seatbelts and sunscreen. Return in 1 year for annual or as needed.   CRubbie BattiestB WHNP-BC 8:16 AM 08/23/2022

## 2022-08-23 NOTE — Progress Notes (Signed)
.    Catherine Castillo May 25, 1961 [id number]   History: Postmenopausal 61 y.o. here for u/s evaluation after presenting with PMB 1 week ago. She is s/p breast CA and Tamoxifen use x 5 years.Noticed first episode 7 weeks ago, only when wiping, no need for a pad. Hx of D&C x 2 for thickened endometrium while on tamoxifen therapy.    Gynecologic History Postmenopausal Last Pap: 2018. Results were: normal Last mammogram: 02/23/22. Results were: normal Last colonoscopy: 05/18/22 HRT use: never  Obstetric History OB History  Gravida Para Term Preterm AB Living  0 0       0  SAB IAB Ectopic Multiple Live Births                The following portions of the patient's history were reviewed and updated as appropriate: allergies, current medications, past family history, past medical history, past social history, past surgical history, and problem list.  Review of Systems Pertinent items noted in HPI and remainder of comprehensive ROS otherwise negative.  Past medical history, past surgical history, family history and social history were all reviewed and documented in the EPIC chart.  Exam:  Vitals:   08/16/22 1012  BP: (!) 158/94  Weight: 217 lb (98.4 kg)  Height: '5\' 8"'$  (1.727 m)   Body mass index is 32.99 kg/m.  Narrative & Impression Indication: PMB, tamoxifen use x 5 years   Vaginal u/s   Anteverted uterus, normal size and shape Several small intramural fibroids   Small amount of fluid within the endometrial cavity outlines smooth walls Combined thickness of 6.37m   No intracavitary masses or abnormal blood flow seen   Both ovaries atrophic No adnexal masses or free fluid   The indications for endometrial biopsy were reviewed.    Risks of the biopsy including cramping, bleeding, infection, uterine perforation, inadequate specimen and need for additional procedures  were discussed.  The patient states she understands and agrees to undergo procedure today. Consent obtained.   Time out was performed.   Speculum inserted into the vagina, cervix visualized and was prepped with Betadine. A single-toothed tenaculum was placed on the anterior lip of the cervix to stabilize it.  Os was found to be stenotic, an os finder was used to attempt to dilate the cervix and patient could not continue the procedure due to discomfort.  Assessment/Plan:   1. Post-menopausal bleeding  2. Thickened endometrium Will bring back for EMB with Valium and cytotec to allow for easier dilation  - diazepam (VALIUM) 5 MG tablet; Take 1 tablet (5 mg total) by mouth once for 1 dose. 1 hour before procedure  Dispense: 1 tablet; Refill: 0 - misoprostol (CYTOTEC) 200 MCG tablet; Place 2 tablets (400 mcg total) vaginally once for 1 dose. The night before procedure  Dispense: 1 tablet; Refill: 0    Marsh Heckler B WHNP-BC, 10:29 AM 08/16/2022

## 2022-08-24 ENCOUNTER — Encounter: Payer: Self-pay | Admitting: Family Medicine

## 2022-08-24 NOTE — Telephone Encounter (Signed)
Patient reports a weeks worth of morning BP and wanted to know what he can do he is out of town till next week.  Was going to recommend a visit on his return

## 2022-08-25 LAB — CYTOLOGY - PAP
Comment: NEGATIVE
Diagnosis: NEGATIVE
High risk HPV: NEGATIVE

## 2022-08-30 ENCOUNTER — Other Ambulatory Visit: Payer: Self-pay | Admitting: Family Medicine

## 2022-08-30 ENCOUNTER — Telehealth: Payer: Self-pay

## 2022-08-30 NOTE — Telephone Encounter (Signed)
[facility] called for clarification on Quantity for cytotec RX.  QTY 1 with Directions: Place 2 tablets (400 mcg total) vaginally once for 1 dose. The night before procedure.  Pharmacy wanted to know if ok for QTY 2.  Told ok.

## 2022-09-05 ENCOUNTER — Ambulatory Visit: Payer: 59 | Admitting: Obstetrics and Gynecology

## 2022-09-05 ENCOUNTER — Encounter: Payer: Self-pay | Admitting: Obstetrics and Gynecology

## 2022-09-05 ENCOUNTER — Encounter (HOSPITAL_COMMUNITY): Payer: Self-pay | Admitting: Gastroenterology

## 2022-09-05 ENCOUNTER — Other Ambulatory Visit (HOSPITAL_COMMUNITY)
Admission: RE | Admit: 2022-09-05 | Discharge: 2022-09-05 | Disposition: A | Discharge status: Home / Self Care | Payer: 59 | Source: Ambulatory Visit | Attending: Obstetrics and Gynecology | Admitting: Obstetrics and Gynecology

## 2022-09-05 VITALS — BP 138/80 | Ht 68.0 in | Wt 216.0 lb

## 2022-09-05 DIAGNOSIS — N859 Noninflammatory disorder of uterus, unspecified: Secondary | ICD-10-CM | POA: Insufficient documentation

## 2022-09-05 DIAGNOSIS — N950 Postmenopausal bleeding: Secondary | ICD-10-CM | POA: Diagnosis present

## 2022-09-05 NOTE — Patient Instructions (Signed)
Endometrial Biopsy  An endometrial biopsy is a procedure to remove tissue samples from the endometrium, which is the lining of the uterus. The tissue that is removed can then be checked under a microscope for disease. This procedure is used to diagnose conditions such as endometrial cancer, endometrial tuberculosis, polyps, or other inflammatory conditions. This procedure may also be used to investigate uterine bleeding to determine where you are in your menstrual cycle or how your hormone levels are affecting the lining of the uterus. Tell a health care provider about: Any allergies you have. All medicines you are taking, including vitamins, herbs, eye drops, creams, and over-the-counter medicines. Any problems you or family members have had with anesthetic medicines. Any bleeding problems you have. Any surgeries you have had. Any medical conditions you have. Whether you are pregnant or may be pregnant. What are the risks? Your health care provider will talk with you about risks. These may include: Bleeding. Pelvic infection. Puncture of the wall of the uterus with the biopsy device (rare). Allergic reactions to medicines. What happens before the procedure? Keep a record of your menstrual cycles as told by your health care provider. You may need to schedule your procedure for a specific time in your cycle. Bring a sanitary pad in case you need to wear one after the procedure. Ask your health care provider about: Changing or stopping your regular medicines. These include any diabetes medicines or blood thinners you take. Taking medicines such as aspirin and ibuprofen. These medicines can thin your blood. Do not take these medicines unless your health care provider tells you to. Taking over-the-counter medicines, vitamins, herbs, and supplements. Plan to have someone take you home from the hospital or clinic. What happens during the procedure? You will lie on an exam table with your feet  and legs supported as in a pelvic exam. Your health care provider will insert an instrument into your vagina to see your cervix. Your cervix will be cleansed with an antiseptic solution. A medicine (local anesthetic) will be used to numb the cervix. A forceps instrument will be used to hold your cervix steady for the biopsy. A thin, rod-like instrument (uterine sound) will be inserted through your cervix to determine the length of your uterus and the location where the biopsy sample will be removed. A thin, flexible tube (catheter) will be inserted through your cervix and into the uterus. The catheter will be used to collect the biopsy sample from your endometrial tissue. The tube and instruments will be removed, and the tissue sample will be sent to a lab for examination. The procedure may vary among health care providers and hospitals. What happens after the procedure? Your blood pressure, heart rate, breathing rate, and blood oxygen level will be monitored until you leave the hospital or clinic. It is up to you to get the results of your procedure. Ask your health care provider, or the department that is doing the procedure, when your results will be ready. Summary An endometrial biopsy is a procedure to remove tissue samples from the endometrium, which is the lining of the uterus. This procedure is used to diagnose conditions such as endometrial cancer, endometrial tuberculosis, polyps, or other inflammatory conditions. It is up to you to get the results of your procedure. Ask your health care provider, or the department that is doing the procedure, when your results will be ready. This information is not intended to replace advice given to you by your health care provider. Make sure you   discuss any questions you have with your health care provider. Document Revised: 01/18/2022 Document Reviewed: 01/18/2022 Elsevier Patient Education  2023 [employer]  

## 2022-09-05 NOTE — Progress Notes (Signed)
GYNECOLOGY  VISIT   HPI: 60 y.o.   Single  Caucasian  female   G0P0 with No LMP recorded. Patient is postmenopausal.   here for   endometrial biopsy for postmenopausal bleeding.   She states she has a pink drop of bleeding randomly starting 6 weeks ago.   Hx breast and cancer and use of Tamoxifen.  Last Tamoxifen use was in December of 2015.   Status post hysteroscopy x 2, 7/11 and 8/13. Pathology 7/11 showed benign weakly proliferative endometrium, benign endometrial polyp. Pathology 813 showed proliferative endometrium and polypoid endometrium, benign fibroid.  Pelvic US done on 08/23/22 showing: Uterus 7.12 x 5.23 x 3.93 cm.  4 intramural fibroids, largest 1.62 cm.  EMS 6.03 and fluid noted inside endometrial canal with no intracavitary masses.  Bilateral ovaries atrophic.  No adnexal mass.  No free fluid.   Endometrial biopsy was not successful in the office on 08/23/22.    She took cytotec, 400 mcg last hs.   She took valium 5 mg prior to visit today.  She has a driver with her.   Hx partial colectomy for diverticulitis.   GYNECOLOGIC HISTORY: No LMP recorded. Patient is postmenopausal. Contraception:  post menopausal Menopausal hormone therapy:  n/a Last mammogram:  02/07/22 BI-RADS CATEGORY 2 Benign Last pap smear:   08/23/22 HPV neg, normal cella.   04/13/12 neg        OB History     Gravida  0   Para  0   Term      Preterm      AB      Living  0      SAB      IAB      Ectopic      Multiple      Live Births                 Patient Active Problem List   Diagnosis Date Noted   Migraine 08/08/2017   Ventral hernia 07/01/2015   History of breast cancer 07/15/2013   Post-menopausal bleeding 05/07/2012   Arthritis    History of chicken pox    Diverticulitis    H/O measles    General medical examination 10/07/2011   History of diverticulitis of colon 07/03/2010   Hypothyroidism 05/11/2010   Diabetes type 2, controlled (HCC) 05/11/2010    Hyperlipidemia associated with type 2 diabetes mellitus (HCC) 05/11/2010   HYPERTENSION, BENIGN ESSENTIAL 05/11/2010   COLONIC POLYPS, HX OF 05/11/2010   DIVERTICULITIS, HX OF 05/11/2010   Endometrial mass 05/07/2010   H/O menorrhagia 05/07/2010    Past Medical History:  Diagnosis Date   Arthritis    "knees" (08/27/2015)   Cancer of left breast (HCC)    Colon polyps    Diverticulitis    Endometrial mass 05/07/10   Graves disease    "zapped in 1996"   H/O measles    H/O menorrhagia 05/07/10   History of chicken pox    History of diverticulitis of colon 07/03/10   Hyperlipidemia    Hypertension    Hypothyroidism    Migraine    "once q couple years now" (08/27/2015)   Peritonitis (HCC) 07/03/10   Type II diabetes mellitus (HCC)     Past Surgical History:  Procedure Laterality Date   ABCESS DRAINAGE  10/2009   placement of percutaneous drain and intraabdominal abscess/notes 11/04/2009   APPENDECTOMY  07/03/2010   BOWEL RESECTION N/A 08/27/2015   Procedure: SMALL BOWEL RESECTION;  Surgeon: Thomas   Cornett, MD;  Location: [facility];  Service: General;  Laterality: N/A;   BREAST LUMPECTOMY WITH NEEDLE LOCALIZATION AND AXILLARY LYMPH NODE DISSECTION Left 07/2009   /notes 07/22/2009   COLECTOMY WITH COLOSTOMY CREATION/HARTMANN PROCEDURE  07/03/2010   /notes 07/04/2010   COLOSTOMY TAKEDOWN  09/2010   /notes 10/05/2010   COMBINED HYSTEROSCOPY DIAGNOSTIC / D&C  05/08/2010   COMBINED HYSTEROSCOPY DIAGNOSTIC / D&C  05/2012   /notes 05/29/2012   FLEXIBLE SIGMOIDOSCOPY N/A 07/29/2022   Procedure: FLEXIBLE SIGMOIDOSCOPY;  Surgeon: Hung, Patrick, MD;  Location: [facility];  Service: Gastroenterology;  Laterality: N/A;   HERNIA REPAIR     INCISIONAL HERNIA REPAIR  08/27/2015   open   INCISIONAL HERNIA REPAIR N/A 08/27/2015   Procedure: LAPAROSCOPIC INCISIONAL HERNIA REPAIR CONVERTED TO OPEN;  Surgeon: Thomas Cornett, MD;  Location: [facility];  Service: General;  Laterality: N/A;   LAPAROSCOPIC SIGMOID  COLECTOMY  12/2009   /notes 01/06/2010; secondary to diverticulitis   LAPAROSCOPIC SIGMOID COLECTOMY     /notes 01/06/2010   POLYPECTOMY  05/14/10   SMALL INTESTINE SURGERY  08/27/2015   TONSILLECTOMY     TOTAL KNEE ARTHROPLASTY Bilateral 2008    Current Outpatient Medications  Medication Sig Dispense Refill   ACCU-CHEK AVIVA PLUS test strip USE TO TEST BLOOD SUGAR 1 TO 2 TIMES DAILY AS DIRECTED 100 strip 11   ACCU-CHEK SOFTCLIX LANCETS lancets Please use one lancet each time sugars are tested. Pt is to check sugars 1-2 times daily. 100 each 12   ALPHA LIPOIC ACID PO Take 1 capsule by mouth daily.     Ascorbic Acid (VITAMIN C) 1000 MG tablet Take 1,000 mg by mouth daily.     atorvastatin (LIPITOR) 40 MG tablet TAKE 1 TABLET AT BEDTIME 90 tablet 0   Barberry-Oreg Grape-Goldenseal (BERBERINE COMPLEX PO) Take 2 capsules by mouth daily.     Blood Glucose Monitoring Suppl (ACCU-CHEK AVIVA PLUS) w/Device KIT Please use glucometer to check sugars 1-2 times daily 1 kit 2   cetirizine (ZYRTEC) 10 MG tablet Take 10 mg by mouth daily. Aller-Tec     CHROMIUM PO Take 1 tablet by mouth daily.     Dulaglutide (TRULICITY) 1.5 MG/0.5ML SOPN ADMINISTER 1.5 MG UNDER THE SKIN 1 TIME A WEEK (Patient taking differently: Inject 1.5 mg into the skin every Friday. ADMINISTER 1.5 MG UNDER THE SKIN 1 TIME A WEEK) 2 mL 3   ELDERBERRY PO Take 2 each by mouth daily.     Fish Oil-Cholecalciferol (FISH OIL + D3 PO) Take 1 capsule by mouth daily.     JANUMET 50-500 MG tablet TAKE 1 TABLET TWICE DAILY  WITH MEALS 180 tablet 0   Lancets Misc. (ACCU-CHEK SOFTCLIX LANCET DEV) KIT Please use one lancet each time sugars are tested. Pt checks sugars 1-2 times daily. 1 kit 2   lisinopril (ZESTRIL) 20 MG tablet TAKE 1 TABLET DAILY 90 tablet 1   MAGNESIUM ASPARTATE PO Take 1 tablet by mouth daily.     Multiple Vitamin (MULTIVITAMIN WITH MINERALS) TABS Take 1 tablet by mouth daily.     polyethylene glycol (MIRALAX / GLYCOLAX) packet  Take 17 g by mouth daily.     Probiotic Product (CULTURELLE PROBIOTICS PO) Take 1 capsule by mouth daily.     SYNTHROID 150 MCG tablet TAKE 1 TABLET DAILY BEFORE BREAKFAST 90 tablet 1   misoprostol (CYTOTEC) 200 MCG tablet Place 2 tablets (400 mcg total) vaginally once for 1 dose. The night before procedure 1 tablet 0     No current facility-administered medications for this visit.     ALLERGIES: Penicillins, Amoxicillin, and Penicillin g  Family History  Problem Relation Age of Onset   Coronary artery disease Father    Hypertension Father    Diabetes Father    Cancer Father        lung   Coronary artery disease Maternal Grandfather    Diabetes Maternal Grandfather    Hypertension Maternal Grandfather    Hypertension Other        grandparents   Stroke Paternal Grandfather    Breast cancer Maternal Grandmother    Colon cancer Paternal Grandmother     Social History   Socioeconomic History   Marital status: Single    Spouse name: Not on file   Number of children: Not on file   Years of education: Not on file   Highest education level: Not on file  Occupational History   Not on file  Tobacco Use   Smoking status: Former    Packs/day: 1.50    Years: 20.00    Total pack years: 30.00    Types: Cigarettes    Quit date: 10/18/2007    Years since quitting: 14.8    Passive exposure: Past   Smokeless tobacco: Never  Vaping Use   Vaping Use: Never used  Substance and Sexual Activity   Alcohol use: Yes    Comment: rare   Drug use: No   Sexual activity: Not Currently    Partners: Male    Birth control/protection: Post-menopausal  Other Topics Concern   Not on file  Social History Narrative   Family is in [place], good support system locally.   Social Determinants of Health   Financial Resource Strain: Not on file  Food Insecurity: Not on file  Transportation Needs: Not on file  Physical Activity: Not on file  Stress: Not on file  Social Connections: Not on file  Intimate  Partner Violence: Not on file    Review of Systems  All other systems reviewed and are negative.   PHYSICAL EXAMINATION:    BP 138/80 (BP Location: Left Arm, Patient Position: Sitting, Cuff Size: Normal)   Ht 5' 8" (1.727 m)   Wt 216 lb (98 kg)   BMI 32.84 kg/m     General appearance: alert, cooperative and appears stated age   Pelvic: External genitalia:  no lesions              Urethra:  normal appearing urethra with no masses, tenderness or lesions              Bartholins and Skenes: normal                 Vagina: normal appearing vagina with normal color and discharge, no lesions              Cervix: no lesions                Bimanual Exam:  Uterus:  normal size, contour, position, consistency, mobility, non-tender              Adnexa: no mass, fullness, tenderness                Endometrial biopsy  Consent done.  Sterile prep with Hibiclens.  Paracervical block 10 cc 1% lidocaine, lot [id number], exp 10/18/23.  Tenaculum to anterior cervical lip.  Os finder used successfully.  Pipelle passed to almost 7 cm successfully x 2.   Tissue to pathology. No complications.  Minimal EBL.   Chaperone was present for exam:  Terra  ASSESSMENT  Postmenopausal bleeding.  Fluid in endometrial canal.  EMS 6 mm.  Uterine fibroids.  Hx tamoxifen use in past.   PLAN  Pelvic US images reviewed and indication for endometrial sampling.  FU biopsy results.  Final plan to follow.    An After Visit Summary was printed and given to the patient.     

## 2022-09-06 LAB — SURGICAL PATHOLOGY

## 2022-09-06 NOTE — Progress Notes (Signed)
Attempted to obtain medical history via telephone, unable to reach at this time. HIPAA compliant voicemail message left requesting return call to pre surgical testing department. 

## 2022-09-12 ENCOUNTER — Encounter (HOSPITAL_COMMUNITY): Payer: Self-pay

## 2022-09-13 ENCOUNTER — Ambulatory Visit (HOSPITAL_COMMUNITY)
Admission: RE | Admit: 2022-09-13 | Discharge: 2022-09-13 | Disposition: A | Discharge status: Home / Self Care | Payer: 59 | Attending: Gastroenterology | Admitting: Gastroenterology

## 2022-09-13 ENCOUNTER — Encounter (HOSPITAL_COMMUNITY)
Admission: RE | Disposition: A | Discharge status: Home / Self Care | Payer: Self-pay | Source: Home / Self Care | Attending: Gastroenterology

## 2022-09-13 ENCOUNTER — Encounter (HOSPITAL_COMMUNITY): Payer: Self-pay | Admitting: Gastroenterology

## 2022-09-13 ENCOUNTER — Ambulatory Visit (HOSPITAL_BASED_OUTPATIENT_CLINIC_OR_DEPARTMENT_OTHER): Payer: 59 | Admitting: Anesthesiology

## 2022-09-13 ENCOUNTER — Ambulatory Visit (HOSPITAL_COMMUNITY): Payer: 59 | Admitting: Anesthesiology

## 2022-09-13 ENCOUNTER — Other Ambulatory Visit: Payer: Self-pay

## 2022-09-13 DIAGNOSIS — D122 Benign neoplasm of ascending colon: Secondary | ICD-10-CM | POA: Insufficient documentation

## 2022-09-13 DIAGNOSIS — E119 Type 2 diabetes mellitus without complications: Secondary | ICD-10-CM | POA: Insufficient documentation

## 2022-09-13 DIAGNOSIS — Z1211 Encounter for screening for malignant neoplasm of colon: Secondary | ICD-10-CM | POA: Diagnosis present

## 2022-09-13 DIAGNOSIS — Z87891 Personal history of nicotine dependence: Secondary | ICD-10-CM

## 2022-09-13 DIAGNOSIS — E669 Obesity, unspecified: Secondary | ICD-10-CM | POA: Insufficient documentation

## 2022-09-13 DIAGNOSIS — Z6832 Body mass index (BMI) 32.0-32.9, adult: Secondary | ICD-10-CM | POA: Insufficient documentation

## 2022-09-13 DIAGNOSIS — K5730 Diverticulosis of large intestine without perforation or abscess without bleeding: Secondary | ICD-10-CM

## 2022-09-13 DIAGNOSIS — Z86010 Personal history of colonic polyps: Secondary | ICD-10-CM

## 2022-09-13 DIAGNOSIS — Z980 Intestinal bypass and anastomosis status: Secondary | ICD-10-CM | POA: Insufficient documentation

## 2022-09-13 DIAGNOSIS — D124 Benign neoplasm of descending colon: Secondary | ICD-10-CM

## 2022-09-13 DIAGNOSIS — I10 Essential (primary) hypertension: Secondary | ICD-10-CM

## 2022-09-13 HISTORY — PX: HEMOSTASIS CLIP PLACEMENT: SHX6857

## 2022-09-13 HISTORY — PX: COLONOSCOPY WITH PROPOFOL: SHX5780

## 2022-09-13 HISTORY — PX: POLYPECTOMY: SHX5525

## 2022-09-13 LAB — GLUCOSE, CAPILLARY: Glucose-Capillary: 137 mg/dL — ABNORMAL HIGH (ref 70–99)

## 2022-09-13 SURGERY — COLONOSCOPY WITH PROPOFOL
Anesthesia: Monitor Anesthesia Care

## 2022-09-13 MED ORDER — PROPOFOL 500 MG/50ML IV EMUL
INTRAVENOUS | Status: AC
  Filled 2022-09-13: qty 50

## 2022-09-13 MED ORDER — LACTATED RINGERS IV SOLN
INTRAVENOUS | Status: DC
  Administered 2022-09-13: 1000 mL via INTRAVENOUS

## 2022-09-13 MED ORDER — PROPOFOL 10 MG/ML IV BOLUS
INTRAVENOUS | Status: DC | PRN
  Administered 2022-09-13 (×5): 20 mg via INTRAVENOUS

## 2022-09-13 MED ORDER — PROPOFOL 1000 MG/100ML IV EMUL
INTRAVENOUS | Status: AC
  Filled 2022-09-13: qty 100

## 2022-09-13 MED ORDER — PROPOFOL 500 MG/50ML IV EMUL
INTRAVENOUS | Status: DC | PRN
  Administered 2022-09-13: 150 ug/kg/min via INTRAVENOUS

## 2022-09-13 MED ORDER — SODIUM CHLORIDE 0.9 % IV SOLN: INTRAVENOUS | Status: DC

## 2022-09-13 SURGICAL SUPPLY — 22 items
ELECT REM PT RETURN 9FT ADLT (ELECTROSURGICAL)
ELECTRODE REM PT RTRN 9FT ADLT (ELECTROSURGICAL) IMPLANT
FCP BXJMBJMB 240X2.8X (CUTTING FORCEPS)
FLOOR PAD 36X40 (MISCELLANEOUS) ×3
FORCEPS BIOP RAD 4 LRG CAP 4 (CUTTING FORCEPS) IMPLANT
FORCEPS BIOP RJ4 240 W/NDL (CUTTING FORCEPS)
FORCEPS BXJMBJMB 240X2.8X (CUTTING FORCEPS) IMPLANT
INJECTOR/SNARE I SNARE (MISCELLANEOUS) IMPLANT
LUBRICANT JELLY 4.5OZ STERILE (MISCELLANEOUS) IMPLANT
MANIFOLD NEPTUNE II (INSTRUMENTS) IMPLANT
NDL SCLEROTHERAPY 25GX240 (NEEDLE) IMPLANT
NEEDLE SCLEROTHERAPY 25GX240 (NEEDLE) IMPLANT
PAD FLOOR 36X40 (MISCELLANEOUS) ×3 IMPLANT
PROBE APC STR FIRE (PROBE) IMPLANT
PROBE INJECTION GOLD (MISCELLANEOUS)
PROBE INJECTION GOLD 7FR (MISCELLANEOUS) IMPLANT
SNARE ROTATE MED OVAL 20MM (MISCELLANEOUS) IMPLANT
SYR 50ML LL SCALE MARK (SYRINGE) IMPLANT
TRAP SPECIMEN MUCOUS 40CC (MISCELLANEOUS) IMPLANT
TUBING ENDO SMARTCAP PENTAX (MISCELLANEOUS) IMPLANT
TUBING IRRIGATION ENDOGATOR (MISCELLANEOUS) ×3 IMPLANT
WATER STERILE IRR 1000ML POUR (IV SOLUTION) IMPLANT

## 2022-09-13 NOTE — H&P (Signed)
Catherine Castillo HPI: The patient is here for resection of a large ascending colon polyp.  Her prior attempt failed as she had a poor prep.  Past Medical History:  Diagnosis Date   Arthritis    "knees" (08/27/2015)   Cancer of left breast ([place])    Colon polyps    Diverticulitis    Endometrial mass 05/07/10   Graves disease    "zapped in 1996"   H/O measles    H/O menorrhagia 05/07/10   History of chicken pox    History of diverticulitis of colon 07/03/10   Hyperlipidemia    Hypertension    Hypothyroidism    Migraine    "once q couple years now" (08/27/2015)   Peritonitis ([place]) 07/03/10   Type II diabetes mellitus ([place])     Past Surgical History:  Procedure Laterality Date   ABCESS DRAINAGE  10/2009   placement of percutaneous drain and intraabdominal abscess/notes 11/04/2009   APPENDECTOMY  07/03/2010   BOWEL RESECTION N/A 08/27/2015   Procedure: SMALL BOWEL RESECTION;  Surgeon: Erroll Luna, MD;  Location: [facility];  Service: General;  Laterality: N/A;   BREAST LUMPECTOMY WITH NEEDLE LOCALIZATION AND AXILLARY LYMPH NODE DISSECTION Left 07/2009   Archie Endo 07/22/2009   COLECTOMY WITH COLOSTOMY CREATION/HARTMANN PROCEDURE  07/03/2010   Archie Endo 07/04/2010   COLOSTOMY TAKEDOWN  09/2010   Archie Endo 10/05/2010   COMBINED HYSTEROSCOPY DIAGNOSTIC / D&C  05/08/2010   COMBINED HYSTEROSCOPY DIAGNOSTIC / D&C  05/2012   Archie Endo 05/29/2012   FLEXIBLE SIGMOIDOSCOPY N/A 07/29/2022   Procedure: FLEXIBLE SIGMOIDOSCOPY;  Surgeon: Carol Ada, MD;  Location: [facility];  Service: Gastroenterology;  Laterality: N/A;   HERNIA REPAIR     INCISIONAL HERNIA REPAIR  08/27/2015   open   INCISIONAL HERNIA REPAIR N/A 08/27/2015   Procedure: LAPAROSCOPIC INCISIONAL HERNIA REPAIR CONVERTED TO OPEN;  Surgeon: Erroll Luna, MD;  Location: [facility];  Service: General;  Laterality: N/A;   LAPAROSCOPIC SIGMOID COLECTOMY  12/2009   Archie Endo 01/06/2010; secondary to diverticulitis   LAPAROSCOPIC SIGMOID COLECTOMY      Archie Endo 01/06/2010   POLYPECTOMY  05/14/10   SMALL INTESTINE SURGERY  08/27/2015   TONSILLECTOMY     TOTAL KNEE ARTHROPLASTY Bilateral 2008    Family History  Problem Relation Age of Onset   Coronary artery disease Father    Hypertension Father    Diabetes Father    Cancer Father        lung   Coronary artery disease Maternal Grandfather    Diabetes Maternal Grandfather    Hypertension Maternal Grandfather    Hypertension Other        grandparents   Stroke Paternal Grandfather    Breast cancer Maternal Grandmother    Colon cancer Paternal Grandmother     Social History:  reports that she quit smoking about 14 years ago. Her smoking use included cigarettes. She has a 30.00 pack-year smoking history. She has been exposed to tobacco smoke. She has never used smokeless tobacco. She reports current alcohol use. She reports that she does not use drugs.  Allergies:  Allergies  Allergen Reactions   Food Other (See Comments)    Sugar substitute (artificial sweeteners)-migraines   Penicillins Hives    Has patient had a PCN reaction causing immediate rash, facial/tongue/throat swelling, SOB or lightheadedness with hypotension: No Has patient had a PCN reaction causing severe rash involving mucus membranes or skin necrosis: No Has patient had a PCN reaction that required hospitalization No Has patient had a  PCN reaction occurring within the last 10 years: No If all of the above answers are "NO", then may proceed with Cephalosporin use.    Amoxicillin Hives and Rash   Penicillin G Rash    Medications: Scheduled: Continuous:  sodium chloride     lactated ringers 1,000 mL (09/13/22 1138)    Results for orders placed or performed during the hospital encounter of 09/13/22 (from the past 24 hour(s))  Glucose, capillary     Status: Abnormal   Collection Time: 09/13/22 11:37 AM  Result Value Ref Range   Glucose-Capillary 137 (H) 70 - 99 mg/dL     No results found.  ROS:  As stated  above in the HPI otherwise negative.  Blood pressure (!) 178/83, pulse 82, temperature 98.5 F (36.9 C), temperature source Tympanic, resp. rate 20, height '5\' 8"'$  (1.727 m), weight 98 kg, SpO2 95 %.    PE: Gen: NAD, Alert and Oriented HEENT:  [place]/AT, EOMI Neck: Supple, no LAD Lungs: CTA Bilaterally CV: RRR without M/G/R ABD: Soft, NTND, +BS Ext: No C/C/E  Assessment/Plan: 1) Personal history of polyp - colonoscopy.  Zacchaeus Halm D 09/13/2022, 11:57 AM

## 2022-09-13 NOTE — Discharge Instructions (Signed)

## 2022-09-13 NOTE — Anesthesia Postprocedure Evaluation (Signed)
Anesthesia Post Note  Patient: Francena A Rubert  Procedure(s) Performed: COLONOSCOPY WITH PROPOFOL POLYPECTOMY (Left)     Patient location during evaluation: PACU Anesthesia Type: MAC Level of consciousness: awake Pain management: pain level controlled Vital Signs Assessment: post-procedure vital signs reviewed and stable Respiratory status: spontaneous breathing, nonlabored ventilation and respiratory function stable Cardiovascular status: stable and blood pressure returned to baseline Postop Assessment: no apparent nausea or vomiting Anesthetic complications: no   No notable events documented.  Last Vitals:  Vitals:   09/13/22 1301 09/13/22 1311  BP: (!) 125/58 (!) 152/72  Pulse: 87 75  Resp: 16 14  Temp:    SpO2: 94% 94%    Last Pain:  Vitals:   09/13/22 1311  TempSrc:   PainSc: 0-No pain                 Nilda Simmer

## 2022-09-13 NOTE — Op Note (Signed)
[facility] Patient Name: Catherine Castillo Procedure Date: 09/13/2022 MRN: [id number] Attending MD: Carol Ada , MD, [id number] Date of Birth: 17-Jan-1961 CSN: [id number] Age: 60 Admit Type: Outpatient Procedure:                Colonoscopy Indications:              High risk colon cancer surveillance: Personal                            history of colonic polyps Providers:                Carol Ada, MD, William Dalton, Technician, Burtis Junes, RN Referring MD:              Medicines:                 Complications:            No immediate complications. Estimated Blood Loss:     Estimated blood loss: none. Procedure:                Pre-Anesthesia Assessment:                           - Prior to the procedure, a History and Physical                            was performed, and patient medications and                            allergies were reviewed. The patient's tolerance of                            previous anesthesia was also reviewed. The risks                            and benefits of the procedure and the sedation                            options and risks were discussed with the patient.                            All questions were answered, and informed consent                            was obtained. Prior Anticoagulants: The patient has                            taken no anticoagulant or antiplatelet agents. ASA                            Grade Assessment: III - A patient with severe                            systemic disease. After  reviewing the risks and                            benefits, the patient was deemed in satisfactory                            condition to undergo the procedure.                           - Sedation was administered by an anesthesia                            professional. Deep sedation was attained.                           After obtaining informed consent, the colonoscope                             was passed under direct vision. Throughout the                            procedure, the patient's blood pressure, pulse, and                            oxygen saturations were monitored continuously. The                            CF-HQ190L ([id number]) Olympus colonoscope was                            introduced through the anus and advanced to the the                            cecum, identified by appendiceal orifice and                            ileocecal valve. The colonoscopy was performed                            without difficulty. The patient tolerated the                            procedure well. The quality of the bowel                            preparation was evaluated using the BBPS [facility]                            Bowel Preparation Scale) with scores of: Right                            Colon = 3 (entire mucosa seen well with no residual  staining, small fragments of stool or opaque                            liquid), Transverse Colon = 3 (entire mucosa seen                            well with no residual staining, small fragments of                            stool or opaque liquid) and Left Colon = 3 (entire                            mucosa seen well with no residual staining, small                            fragments of stool or opaque liquid). The total                            BBPS score equals 9. The quality of the bowel                            preparation was good. The ileocecal valve,                            appendiceal orifice, and rectum were photographed. Scope In: 12:12:34 PM Scope Out: 67:12:45 PM Scope Withdrawal Time: 0 hours 31 minutes 0 seconds  Total Procedure Duration: 0 hours 33 minutes 37 seconds  Findings:      A 20 mm polyp was found in the ascending colon. The polyp was sessile.       The polyp was removed with a cold snare. The polyp was removed with a       piecemeal technique using a cold  snare. Resection and retrieval were       complete. To prevent bleeding post-intervention, six hemostatic clips       were successfully placed (MR safe). Clip manufacturer: [employer]. There was no bleeding at the end of the procedure.      A 3 mm polyp was found in the descending colon. The polyp was sessile.       The polyp was removed with a cold snare. Resection and retrieval were       complete.      Scattered large-mouthed and small-mouthed diverticula were found in the       sigmoid colon and descending colon.      There was evidence of a prior end-to-end colo-colonic anastomosis in the       recto-sigmoid colon. This was patent and was characterized by healthy       appearing mucosa. The anastomosis was traversed.      The large, approximately 2 cm, sessile ascending colon polyp was       identified. It was removed in a piecemeal fashion with a cold snare       using the underwater technique. A wide resection was achieved and the       mucosal defect was closed with 6 hemoclips (3 mantis clips and 3 ultra  clips). Impression:               - One 20 mm polyp in the ascending colon, removed                            with a cold snare and removed piecemeal using a                            cold snare. Resected and retrieved. Clip                            manufacturer: [employer]. Clips (MR safe)                            were placed.                           - One 3 mm polyp in the descending colon, removed                            with a cold snare. Resected and retrieved.                           - Diverticulosis in the sigmoid colon and in the                            descending colon.                           - Patent end-to-end colo-colonic anastomosis,                            characterized by healthy appearing mucosa. Moderate Sedation:      Not Applicable - Patient had care per Anesthesia. Recommendation:           - Patient has a  contact number available for                            emergencies. The signs and symptoms of potential                            delayed complications were discussed with the                            patient. Return to normal activities tomorrow.                            Written discharge instructions were provided to the                            patient.                           - Resume previous diet.                           -  Continue present medications.                           - Await pathology results.                           - Repeat colonoscopy in 6 months for surveillance. Procedure Code(s):        --- Professional ---                           [id number], Colonoscopy, flexible; with removal of                            tumor(s), polyp(s), or other lesion(s) by snare                            technique Diagnosis Code(s):        --- Professional ---                           D12.2, Benign neoplasm of ascending colon                           Z86.010, Personal history of colonic polyps                           D12.4, Benign neoplasm of descending colon                           Z98.0, Intestinal bypass and anastomosis status                           K57.30, Diverticulosis of large intestine without                            perforation or abscess without bleeding CPT copyright 2022 American Medical Association. All rights reserved. The codes documented in this report are preliminary and upon coder review may  be revised to meet current compliance requirements. Carol Ada, MD Carol Ada, MD 09/13/2022 12:56:45 PM This report has been signed electronically. Number of Addenda: 0

## 2022-09-13 NOTE — Transfer of Care (Signed)
Immediate Anesthesia Transfer of Care Note  Patient: Catherine Castillo  Procedure(s) Performed: COLONOSCOPY WITH PROPOFOL POLYPECTOMY (Left)  Patient Location: Endoscopy Unit  Anesthesia Type:MAC  Level of Consciousness: drowsy  Airway & Oxygen Therapy: Patient Spontanous Breathing and Patient connected to face mask  Post-op Assessment: Report given to RN and Post -op Vital signs reviewed and stable  Post vital signs: Reviewed and stable  Last Vitals:  Vitals Value Taken Time  BP    Temp    Pulse 93 09/13/22 1252  Resp 17 09/13/22 1252  SpO2 96 % 09/13/22 1252  Vitals shown include unvalidated device data.  Last Pain:  Vitals:   09/13/22 1127  TempSrc: Tympanic  PainSc: 0-No pain         Complications: No notable events documented.

## 2022-09-13 NOTE — Anesthesia Preprocedure Evaluation (Addendum)
Anesthesia Evaluation  Patient identified by MRN, date of birth, ID band Patient awake    Reviewed: Allergy & Precautions, NPO status , Patient's Chart, lab work & pertinent test results  History of Anesthesia Complications Negative for: history of anesthetic complications  Airway Mallampati: III  TM Distance: >3 FB Neck ROM: Full    Dental no notable dental hx.    Pulmonary neg shortness of breath, neg sleep apnea, neg COPD, neg recent URI, former smoker   Pulmonary exam normal breath sounds clear to auscultation       Cardiovascular hypertension, Pt. on medications (-) angina (-) Past MI, (-) Cardiac Stents and (-) CABG (-) dysrhythmias  Rhythm:Regular Rate:Normal     Neuro/Psych  Headaches    GI/Hepatic negative GI ROS, Neg liver ROS,,,  Endo/Other  diabetes, Type 2Hypothyroidism  Grave's disease  Renal/GU negative Renal ROS     Musculoskeletal  (+) Arthritis ,    Abdominal  (+) + obese  Peds  Hematology negative hematology ROS (+)   Anesthesia Other Findings HLD  Last Trulicity: 58/30/9407  Reproductive/Obstetrics                              Anesthesia Physical Anesthesia Plan  ASA: 3  Anesthesia Plan: MAC   Post-op Pain Management:    Induction: Intravenous  PONV Risk Score and Plan: 2 and Propofol infusion  Airway Management Planned: Natural Airway and Nasal Cannula  Additional Equipment:   Intra-op Plan:   Post-operative Plan:   Informed Consent: I have reviewed the patients History and Physical, chart, labs and discussed the procedure including the risks, benefits and alternatives for the proposed anesthesia with the patient or authorized representative who has indicated his/her understanding and acceptance.     Dental advisory given  Plan Discussed with: CRNA and Anesthesiologist  Anesthesia Plan Comments: (Discussed with patient risks of MAC including,  but not limited to, minor pain or discomfort, hearing people in the room, and possible need for backup general anesthesia. Risks for general anesthesia also discussed including, but not limited to, sore throat, hoarse voice, chipped/damaged teeth, injury to vocal cords, nausea and vomiting, allergic reactions, lung infection, heart attack, stroke, and death. All questions answered. )         Anesthesia Quick Evaluation

## 2022-09-14 LAB — SURGICAL PATHOLOGY

## 2022-09-15 ENCOUNTER — Other Ambulatory Visit: Payer: 59

## 2022-09-15 ENCOUNTER — Other Ambulatory Visit: Payer: 59 | Admitting: Radiology

## 2022-09-16 ENCOUNTER — Encounter (HOSPITAL_COMMUNITY): Payer: Self-pay | Admitting: Gastroenterology

## 2022-09-19 ENCOUNTER — Ambulatory Visit: Payer: 59 | Admitting: Family Medicine

## 2022-09-19 ENCOUNTER — Telehealth: Payer: Self-pay

## 2022-09-19 ENCOUNTER — Encounter: Payer: Self-pay | Admitting: Family Medicine

## 2022-09-19 VITALS — BP 138/76 | HR 74 | Temp 97.8°F | Ht 68.0 in | Wt 212.8 lb

## 2022-09-19 DIAGNOSIS — I10 Essential (primary) hypertension: Secondary | ICD-10-CM | POA: Diagnosis not present

## 2022-09-19 DIAGNOSIS — E119 Type 2 diabetes mellitus without complications: Secondary | ICD-10-CM

## 2022-09-19 DIAGNOSIS — E1169 Type 2 diabetes mellitus with other specified complication: Secondary | ICD-10-CM | POA: Diagnosis not present

## 2022-09-19 DIAGNOSIS — E785 Hyperlipidemia, unspecified: Secondary | ICD-10-CM | POA: Diagnosis not present

## 2022-09-19 DIAGNOSIS — E039 Hypothyroidism, unspecified: Secondary | ICD-10-CM

## 2022-09-19 LAB — COMPREHENSIVE METABOLIC PANEL
ALT: 22 U/L (ref 0–35)
AST: 19 U/L (ref 0–37)
Albumin: 4.5 g/dL (ref 3.5–5.2)
Alkaline Phosphatase: 88 U/L (ref 39–117)
BUN: 17 mg/dL (ref 6–23)
CO2: 30 mEq/L (ref 19–32)
Calcium: 9.8 mg/dL (ref 8.4–10.5)
Chloride: 98 mEq/L (ref 96–112)
Creatinine, Ser: 0.63 mg/dL (ref 0.40–1.20)
GFR: 94.7 mL/min (ref >60.00)
Glucose, Bld: 131 mg/dL — ABNORMAL HIGH (ref 70–99)
Potassium: 4 mEq/L (ref 3.5–5.1)
Sodium: 136 mEq/L (ref 135–145)
Total Bilirubin: 0.7 mg/dL (ref 0.2–1.2)
Total Protein: 7.7 g/dL (ref 6.0–8.3)

## 2022-09-19 LAB — MICROALBUMIN / CREATININE URINE RATIO
Creatinine,U: 100.3 mg/dL
Microalb Creat Ratio: 2.5 mg/g (ref 0.0–30.0)
Microalb, Ur: 2.5 mg/dL — ABNORMAL HIGH (ref 0.0–1.9)

## 2022-09-19 LAB — HEMOGLOBIN A1C: Hgb A1c MFr Bld: 7.5 % — ABNORMAL HIGH (ref 4.6–6.5)

## 2022-09-19 LAB — TSH: TSH: 3.05 u[IU]/mL (ref 0.35–5.50)

## 2022-09-19 LAB — LIPID PANEL
Cholesterol: 166 mg/dL (ref 0–200)
HDL: 40.9 mg/dL (ref >39.00)
LDL Cholesterol: 88 mg/dL (ref 0–99)
NonHDL: 125.51
Total CHOL/HDL Ratio: 4
Triglycerides: 190 mg/dL — ABNORMAL HIGH (ref 0.0–149.0)
VLDL: 38 mg/dL (ref 0.0–40.0)

## 2022-09-19 MED ORDER — TRULICITY 3 MG/0.5ML ~~LOC~~ SOAJ: 3.0000 mg | SUBCUTANEOUS | 3 refills | Status: DC

## 2022-09-19 NOTE — Patient Instructions (Addendum)
Schedule your complete physical in 6 months We'll notify you of your lab results and make any changes if needed Continue to work on healthy diet and regular exercise- you can do it! If you need me sooner than 6 months, let me know! Call with any questions or concerns Stay Safe!  Stay Healthy! Happy Holidays!!!

## 2022-09-19 NOTE — Assessment & Plan Note (Signed)
Chronic problem.  Currently on Janument 50/'500mg'$  BID and Trulicity 1.'5mg'$  weekly.  UTD on foot exam, eye exam.  Microalbumin collected.  Check labs.  Adjust meds prn

## 2022-09-19 NOTE — Addendum Note (Signed)
Addended by: Midge Minium on: 09/19/2022 04:11 PM   Modules accepted: Orders

## 2022-09-19 NOTE — Assessment & Plan Note (Signed)
Chronic problem.  Currently on Lipitor 40mg daily w/o difficulty.  Check labs.  Adjust meds prn  

## 2022-09-19 NOTE — Telephone Encounter (Signed)
-----   Message from Midge Minium, MD sent at 09/19/2022  4:11 PM EST ----- Your labs look great w/ the exception of A1C which has jumped from 6.6 --> 7.5%.  This could be the cause of your palpitations, irritability, sweating, etc bc your thyroid looks great.  I think we should increase the dose of Trulicity to '3mg'$  weekly.  I will send in a new prescription for you to pick up and start.  Please let me know if anything changes.

## 2022-09-19 NOTE — Progress Notes (Signed)
   Subjective:    Patient ID: Catherine Castillo, female    DOB: 04-Mar-1961, 61 y.o.   MRN: [id number]  HPI DM- chronic problem, on Janumet 50/'500mg'$  BID, Trulicity 1.'5mg'$  weekly.  UTD on foot exam, eye exam.  Due for microalbumin.  Denies symptomatic lows.  No numbness/tingling of hands/feet above baseline.  HTN- chronic problem, on Lisinopril '20mg'$  daily.  BP was initially elevated but in normal range on recheck.  Pt reports BP has been 'all over the place'.  Pt has been taking '40mg'$  of Lisinopril on days BP is high.  No CP, SOB, HA's, visual changes, edema.  Hyperlipidemia- chronic problem, on Lipitor '40mg'$  daily.  No abd pain, N/V.  Hypothyroid- chronic problem, on Synthroid 146mg daily.  Increased irritability, palpitations.  Feels dose is off.     Review of Systems For ROS see HPI     Objective:   Physical Exam Vitals reviewed.  Constitutional:      General: She is not in acute distress.    Appearance: Normal appearance. She is well-developed. She is obese. She is not ill-appearing.  HENT:     Head: Normocephalic and atraumatic.  Eyes:     Conjunctiva/sclera: Conjunctivae normal.     Pupils: Pupils are equal, round, and reactive to light.  Neck:     Thyroid: No thyromegaly.  Cardiovascular:     Rate and Rhythm: Normal rate and regular rhythm.     Pulses: Normal pulses.     Heart sounds: Normal heart sounds. No murmur heard. Pulmonary:     Effort: Pulmonary effort is normal. No respiratory distress.     Breath sounds: Normal breath sounds.  Abdominal:     General: There is no distension.     Palpations: Abdomen is soft.     Tenderness: There is no abdominal tenderness.  Musculoskeletal:     Cervical back: Normal range of motion and neck supple.     Right lower leg: No edema.     Left lower leg: No edema.  Lymphadenopathy:     Cervical: No cervical adenopathy.  Skin:    General: Skin is warm and dry.  Neurological:     General: No focal deficit present.     Mental  Status: She is alert and oriented to person, place, and time.  Psychiatric:        Behavior: Behavior normal.           Assessment & Plan:

## 2022-09-19 NOTE — Assessment & Plan Note (Signed)
Chronic problem.  Mildly elevated today but pt thinks her thyroid medication dose is too high.  Home BP's have been labile but she remains asymptomatic.  On Lisinopril '20mg'$  daily.  Check labs due to ACE.  Will hold on med adjustments at this time but could consider low dose beta blocker if BP remains elevated.

## 2022-09-19 NOTE — Telephone Encounter (Signed)
Called and LM

## 2022-09-19 NOTE — Assessment & Plan Note (Signed)
Chronic problem.  Pt feels that medication dose is too high.  She is irritable, having palpitations, getting sweaty.  Check labs.  Adjust meds prn

## 2022-09-20 NOTE — Telephone Encounter (Signed)
Left pt a VM stating results and instructions that Trulicity was increased

## 2022-09-20 NOTE — Telephone Encounter (Signed)
Called, no answer, LM

## 2022-09-22 ENCOUNTER — Encounter: Payer: Self-pay | Admitting: Family Medicine

## 2022-09-22 ENCOUNTER — Encounter: Payer: Self-pay | Admitting: Obstetrics and Gynecology

## 2022-09-22 DIAGNOSIS — N950 Postmenopausal bleeding: Secondary | ICD-10-CM

## 2022-09-23 NOTE — Telephone Encounter (Signed)
Left message for patient to call.

## 2022-09-23 NOTE — Telephone Encounter (Signed)
Please contact patient in follow up to her My Chart message.   I recommend she return for a sonohysterogram procedure with me in the office.  We need to check for polyps or small masses that the biopsy potentially could have missed.   She will need an explanation of the procedure.

## 2022-09-23 NOTE — Telephone Encounter (Signed)
Patient aware, order placed. Message sent to appointments to schedule [facility].

## 2022-09-26 ENCOUNTER — Other Ambulatory Visit: Payer: Self-pay

## 2022-09-26 MED ORDER — TRULICITY 3 MG/0.5ML ~~LOC~~ SOAJ: 3.0000 mg | SUBCUTANEOUS | 3 refills | Status: DC

## 2022-09-29 ENCOUNTER — Ambulatory Visit: Payer: 59 | Admitting: Family Medicine

## 2022-11-03 ENCOUNTER — Other Ambulatory Visit: Payer: Self-pay | Admitting: Family Medicine

## 2022-11-03 NOTE — Progress Notes (Deleted)
GYNECOLOGY  VISIT   HPI: 62 y.o.   Single  Caucasian  female   G0P0 with No LMP recorded. Patient is postmenopausal.   here for   shgm  GYNECOLOGIC HISTORY: No LMP recorded. Patient is postmenopausal. Contraception:  post menopausal Menopausal hormone therapy:  n/a Last mammogram:   02/07/22 BI-RADS CATEGORY 2 Benign  Last pap smear:    08/23/22 HPV neg, normal cella.   04/13/12 neg         OB History     Gravida  0   Para  0   Term      Preterm      AB      Living  0      SAB      IAB      Ectopic      Multiple      Live Births                 Patient Active Problem List   Diagnosis Date Noted   Migraine 08/08/2017   Ventral hernia 07/01/2015   History of breast cancer 07/15/2013   Post-menopausal bleeding 05/07/2012   Arthritis    History of chicken pox    Diverticulitis    H/O measles    General medical examination 10/07/2011   History of diverticulitis of colon 07/03/2010   Hypothyroidism 05/11/2010   Diabetes type 2, controlled ([place]) 05/11/2010   Hyperlipidemia associated with type 2 diabetes mellitus ([place]) 05/11/2010   HYPERTENSION, BENIGN ESSENTIAL 05/11/2010   COLONIC POLYPS, HX OF 05/11/2010   DIVERTICULITIS, HX OF 05/11/2010   Endometrial mass 05/07/2010   H/O menorrhagia 05/07/2010    Past Medical History:  Diagnosis Date   Arthritis    "knees" (08/27/2015)   Cancer of left breast ([place])    Colon polyps    Diverticulitis    Endometrial mass 05/07/10   Graves disease    "zapped in 1996"   H/O measles    H/O menorrhagia 05/07/10   History of chicken pox    History of diverticulitis of colon 07/03/10   Hyperlipidemia    Hypertension    Hypothyroidism    Migraine    "once q couple years now" (08/27/2015)   Peritonitis ([place]) 07/03/10   Type II diabetes mellitus ([place])     Past Surgical History:  Procedure Laterality Date   ABCESS DRAINAGE  10/2009   placement of percutaneous drain and intraabdominal abscess/notes 11/04/2009    APPENDECTOMY  07/03/2010   BOWEL RESECTION N/A 08/27/2015   Procedure: SMALL BOWEL RESECTION;  Surgeon: Erroll Luna, MD;  Location: [facility];  Service: General;  Laterality: N/A;   BREAST LUMPECTOMY WITH NEEDLE LOCALIZATION AND AXILLARY LYMPH NODE DISSECTION Left 07/2009   Archie Endo 07/22/2009   COLECTOMY WITH COLOSTOMY CREATION/HARTMANN PROCEDURE  07/03/2010   Ar[facility] Endo 07/04/2010   COLONOSCOPY WITH PROPOFOL N/A 09/13/2022   Procedure: COLONOSCOPY WITH PROPOFOL;  Surgeon: Carol Ada, MD;  Location: [facility];  Service: Gastroenterology;  Laterality: N/A;   COLOSTOMY TAKEDOWN  09/2010   Ar[facility] Endo 10/05/2010   COMBINED HYSTEROSCOPY DIAGNOSTIC / D&C  05/08/2010   COMBINED HYSTEROSCOPY DIAGNOSTIC / D&C  05/2012   [facility] Endo 05/29/2012   FLEXIBLE SIGMOIDOSCOPY N/A 07/29/2022   Procedure: FLEXIBLE SIGMOIDOSCOPY;  Surgeon: Carol Ada, MD;  Location: [facility];  Service: Gastroenterology;  Laterality: N/A;   HEMOSTASIS CLIP PLACEMENT  09/13/2022   Procedure: HEMOSTASIS CLIP PLACEMENT;  Surgeon: Carol Ada, MD;  Location: [facility];  Service: Gastroenterology;;   HERNIA REPAIR  INCISIONAL HERNIA REPAIR  08/27/2015   open   [place] N/A 08/27/2015   Procedure: LAPAROSCOPIC INCISIONAL HERNIA REPAIR CONVERTED TO OPEN;  Surgeon: Erroll Luna, MD;  Location: [facility];  Service: General;  Laterality: N/A;   LAPAROSCOPIC SIGMOID COLECTOMY  12/2009   Ar[facility] Endo 01/06/2010; secondary to diverticulitis   LAPAROSCOPIC SIGMOID COLECTOMY     [facility] Endo 01/06/2010   POLYPECTOMY  05/14/10   POLYPECTOMY Left 09/13/2022   Procedure: POLYPECTOMY;  Surgeon: Carol Ada, MD;  Location: [facility];  Service: Gastroenterology;  Laterality: Left;   SMALL INTESTINE SURGERY  08/27/2015   TONSILLECTOMY     TOTAL KNEE ARTHROPLASTY Bilateral 2008    Current Outpatient Medications  Medication Sig Dispense Refill   ACCU-CHEK AVIVA PLUS test strip USE TO TEST BLOOD SUGAR 1 TO 2 TIMES DAILY AS DIRECTED 100  strip 11   ACCU-CHEK SOFTCLIX LANCETS lancets Please use one lancet each time sugars are tested. Pt is to check sugars 1-2 times daily. 100 each 12   ALPHA LIPOIC ACID PO Take 1 capsule by mouth every evening. With Green Tea     Ascorbic Acid (VITAMIN C) 1000 MG tablet Take 1,000 mg by mouth every evening.     atorvastatin (LIPITOR) 40 MG tablet TAKE 1 TABLET AT BEDTIME (Patient taking differently: Take 40 mg by mouth in the morning.) 90 tablet 0   Berberine Chloride (BERBERINE HCI PO) Take 1,200 mg by mouth every evening.     Blood Glucose Monitoring Suppl (ACCU-CHEK AVIVA PLUS) w/Device KIT Please use glucometer to check sugars 1-2 times daily 1 kit 2   cetirizine (ZYRTEC) 10 MG tablet Take 10 mg by mouth in the morning. Aller-Tec     CHROMIUM PO Take 1 capsule by mouth every evening.     Dulaglutide (TRULICITY) 3 0000000 SOPN Inject 3 mg as directed once a week. 2 mL 3   ELDERBERRY PO Take 2 each by mouth every evening.     Fish Oil-Cholecalciferol (FISH OIL + D3 PO) Take 1 capsule by mouth every evening.     JANUMET 50-500 MG tablet TAKE 1 TABLET TWICE DAILY  WITH MEALS 180 tablet 0   Lancets Misc. (ACCU-CHEK SOFTCLIX LANCET DEV) KIT Please use one lancet each time sugars are tested. Pt checks sugars 1-2 times daily. 1 kit 2   lisinopril (ZESTRIL) 20 MG tablet TAKE 1 TABLET DAILY 90 tablet 1   MAGNESIUM PO Take by mouth every evening.     Multiple Vitamin (MULTIVITAMIN WITH MINERALS) TABS Take 1 tablet by mouth every evening.     polyethylene glycol (MIRALAX / GLYCOLAX) packet Take 17 g by mouth in the morning.     Probiotic Product (CULTURELLE PROBIOTICS PO) Take 1 capsule by mouth in the morning.     SYNTHROID 150 MCG tablet TAKE 1 TABLET DAILY BEFORE BREAKFAST 90 tablet 1   No current facility-administered medications for this visit.     ALLERGIES: Food, Penicillins, Amoxicillin, and Penicillin g  Family History  Problem Relation Age of Onset   Coronary artery disease Father     Hypertension Father    Diabetes Father    Cancer Father        lung   Coronary artery disease Maternal Grandfather    Diabetes Maternal Grandfather    Hypertension Maternal Grandfather    Hypertension Other        grandparents   Stroke Paternal Grandfather    Breast cancer Maternal Grandmother    Colon cancer Paternal Grandmother  Social History   Socioeconomic History   Marital status: Single    Spouse name: Not on file   Number of children: Not on file   Years of education: Not on file   Highest education level: Not on file  Occupational History   Not on file  Tobacco Use   Smoking status: Former    Packs/day: 1.50    Years: 20.00    Total pack years: 30.00    Types: Cigarettes    Quit date: 10/18/2007    Years since quitting: 15.0    Passive exposure: Past   Smokeless tobacco: Never  Vaping Use   Vaping Use: Never used  Substance and Sexual Activity   Alcohol use: Yes    Comment: rare   Drug use: No   Sexual activity: Not Currently    Partners: Male    Birth control/protection: Post-menopausal  Other Topics Concern   Not on file  Social History Narrative   Family is in [place], good support system locally.   Social Determinants of Health   Financial Resource Strain: Not on file  Food Insecurity: Not on file  Transportation Needs: Not on file  Physical Activity: Not on file  Stress: Not on file  Social Connections: Not on file  Intimate Partner Violence: Not on file    Review of Systems  PHYSICAL EXAMINATION:    There were no vitals taken for this visit.    General appearance: alert, cooperative and appears stated age Head: Normocephalic, without obvious abnormality, atraumatic Neck: no adenopathy, supple, symmetrical, trachea midline and thyroid normal to inspection and palpation Lungs: clear to auscultation bilaterally Breasts: normal appearance, no masses or tenderness, No nipple retraction or dimpling, No nipple discharge or bleeding, No axillary  or supraclavicular adenopathy Heart: regular rate and rhythm Abdomen: soft, non-tender, no masses,  no organomegaly Extremities: extremities normal, atraumatic, no cyanosis or edema Skin: Skin color, texture, turgor normal. No rashes or lesions Lymph nodes: Cervical, supraclavicular, and axillary nodes normal. No abnormal inguinal nodes palpated Neurologic: Grossly normal  Pelvic: External genitalia:  no lesions              Urethra:  normal appearing urethra with no masses, tenderness or lesions              Bartholins and Skenes: normal                 Vagina: normal appearing vagina with normal color and discharge, no lesions              Cervix: no lesions                Bimanual Exam:  Uterus:  normal size, contour, position, consistency, mobility, non-tender              Adnexa: no mass, fullness, tenderness              Rectal exam: {yes no:[id number]}.  Confirms.              Anus:  normal sphincter tone, no lesions  Chaperone was present for exam:  ***  ASSESSMENT     PLAN     An After Visit Summary was printed and given to the patient.  ______ minutes face to face time of which over 50% was spent in counseling.

## 2022-11-15 ENCOUNTER — Other Ambulatory Visit: Payer: 59

## 2022-11-15 ENCOUNTER — Other Ambulatory Visit: Payer: 59 | Admitting: Obstetrics and Gynecology

## 2022-11-28 ENCOUNTER — Other Ambulatory Visit: Payer: Self-pay | Admitting: Family Medicine

## 2023-01-05 ENCOUNTER — Other Ambulatory Visit: Payer: Self-pay | Admitting: Family Medicine

## 2023-02-20 ENCOUNTER — Other Ambulatory Visit: Payer: Self-pay | Admitting: Family Medicine

## 2023-02-27 ENCOUNTER — Other Ambulatory Visit: Payer: Self-pay | Admitting: Family Medicine

## 2023-02-28 MED ORDER — JANUMET 50-500 MG PO TABS: 1.0000 | ORAL_TABLET | Freq: Two times a day (BID) | ORAL | 0 refills | Status: DC

## 2023-03-24 ENCOUNTER — Other Ambulatory Visit: Payer: Self-pay | Admitting: Family Medicine

## 2023-04-03 ENCOUNTER — Encounter: Payer: 59 | Admitting: Family Medicine

## 2023-04-25 ENCOUNTER — Other Ambulatory Visit: Payer: Self-pay | Admitting: Family Medicine

## 2023-05-11 LAB — HM MAMMOGRAPHY

## 2023-05-12 ENCOUNTER — Encounter: Payer: Self-pay | Admitting: Family Medicine

## 2023-05-13 ENCOUNTER — Other Ambulatory Visit: Payer: Self-pay | Admitting: Family Medicine

## 2023-05-21 ENCOUNTER — Other Ambulatory Visit: Payer: Self-pay | Admitting: Family Medicine

## 2023-05-22 ENCOUNTER — Other Ambulatory Visit: Payer: Self-pay

## 2023-05-22 MED ORDER — TRULICITY 3 MG/0.5ML ~~LOC~~ SOAJ: 3.0000 mg | SUBCUTANEOUS | 2 refills | Status: DC

## 2023-05-24 ENCOUNTER — Encounter: Payer: Self-pay | Admitting: Family Medicine

## 2023-05-24 MED ORDER — EXENATIDE ER 2 MG ~~LOC~~ PEN: 2.0000 mg | PEN_INJECTOR | SUBCUTANEOUS | 3 refills | Status: DC

## 2023-05-24 NOTE — Telephone Encounter (Signed)
Pt okay with switching Rx what strength should we send for her?

## 2023-06-01 ENCOUNTER — Encounter (INDEPENDENT_AMBULATORY_CARE_PROVIDER_SITE_OTHER): Payer: Self-pay

## 2023-07-13 ENCOUNTER — Ambulatory Visit: Payer: 59 | Admitting: Family Medicine

## 2023-07-13 ENCOUNTER — Encounter: Payer: Self-pay | Admitting: Family Medicine

## 2023-07-13 VITALS — BP 124/78 | HR 82 | Temp 98.7°F | Ht 68.0 in | Wt 207.5 lb

## 2023-07-13 DIAGNOSIS — E1169 Type 2 diabetes mellitus with other specified complication: Secondary | ICD-10-CM | POA: Diagnosis not present

## 2023-07-13 DIAGNOSIS — Z7985 Long-term (current) use of injectable non-insulin antidiabetic drugs: Secondary | ICD-10-CM

## 2023-07-13 DIAGNOSIS — E785 Hyperlipidemia, unspecified: Secondary | ICD-10-CM

## 2023-07-13 DIAGNOSIS — R194 Change in bowel habit: Secondary | ICD-10-CM | POA: Insufficient documentation

## 2023-07-13 DIAGNOSIS — K760 Fatty (change of) liver, not elsewhere classified: Secondary | ICD-10-CM | POA: Insufficient documentation

## 2023-07-13 DIAGNOSIS — I10 Essential (primary) hypertension: Secondary | ICD-10-CM

## 2023-07-13 DIAGNOSIS — E119 Type 2 diabetes mellitus without complications: Secondary | ICD-10-CM

## 2023-07-13 DIAGNOSIS — R300 Dysuria: Secondary | ICD-10-CM | POA: Diagnosis not present

## 2023-07-13 DIAGNOSIS — R141 Gas pain: Secondary | ICD-10-CM | POA: Insufficient documentation

## 2023-07-13 DIAGNOSIS — Z800 Family history of malignant neoplasm of digestive organs: Secondary | ICD-10-CM | POA: Insufficient documentation

## 2023-07-13 LAB — HEPATIC FUNCTION PANEL
ALT: 19 U/L (ref 0–35)
AST: 19 U/L (ref 0–37)
Albumin: 4.4 g/dL (ref 3.5–5.2)
Alkaline Phosphatase: 87 U/L (ref 39–117)
Bilirubin, Direct: 0.1 mg/dL (ref 0.0–0.3)
Total Bilirubin: 0.6 mg/dL (ref 0.2–1.2)
Total Protein: 7.6 g/dL (ref 6.0–8.3)

## 2023-07-13 LAB — BASIC METABOLIC PANEL
BUN: 18 mg/dL (ref 6–23)
CO2: 26 mEq/L (ref 19–32)
Calcium: 10 mg/dL (ref 8.4–10.5)
Chloride: 101 mEq/L (ref 96–112)
Creatinine, Ser: 0.67 mg/dL (ref 0.40–1.20)
GFR: 92.77 mL/min (ref >60.00)
Glucose, Bld: 138 mg/dL — ABNORMAL HIGH (ref 70–99)
Potassium: 4.1 mEq/L (ref 3.5–5.1)
Sodium: 138 mEq/L (ref 135–145)

## 2023-07-13 LAB — CBC WITH DIFFERENTIAL/PLATELET
Basophils Absolute: 0 10*3/uL (ref 0.0–0.1)
Basophils Relative: 0.5 % (ref 0.0–3.0)
Eosinophils Absolute: 0.2 10*3/uL (ref 0.0–0.7)
Eosinophils Relative: 2.7 % (ref 0.0–5.0)
HCT: 39.4 % (ref 36.0–46.0)
Hemoglobin: 13.1 g/dL (ref 12.0–15.0)
Lymphocytes Relative: 19.7 % (ref 12.0–46.0)
Lymphs Abs: 1.8 10*3/uL (ref 0.7–4.0)
MCHC: 33.3 g/dL (ref 30.0–36.0)
MCV: 88.6 fl (ref 78.0–100.0)
Monocytes Absolute: 0.6 10*3/uL (ref 0.1–1.0)
Monocytes Relative: 6.4 % (ref 3.0–12.0)
Neutro Abs: 6.6 10*3/uL (ref 1.4–7.7)
Neutrophils Relative %: 70.7 % (ref 43.0–77.0)
Platelets: 387 10*3/uL (ref 150.0–400.0)
RBC: 4.45 Mil/uL (ref 3.87–5.11)
RDW: 13.6 % (ref 11.5–15.5)
WBC: 9.4 10*3/uL (ref 4.0–10.5)

## 2023-07-13 LAB — POCT URINALYSIS DIPSTICK
Glucose, UA: NEGATIVE
Ketones, UA: NEGATIVE
Nitrite, UA: NEGATIVE
Protein, UA: NEGATIVE
Spec Grav, UA: 1.02 (ref 1.010–1.025)
Urobilinogen, UA: 0.2 E.U./dL
pH, UA: 6 (ref 5.0–8.0)

## 2023-07-13 LAB — LIPID PANEL
Cholesterol: 179 mg/dL (ref 0–200)
HDL: 35.4 mg/dL — ABNORMAL LOW (ref >39.00)
LDL Cholesterol: 103 mg/dL — ABNORMAL HIGH (ref 0–99)
NonHDL: 144.01
Total CHOL/HDL Ratio: 5
Triglycerides: 204 mg/dL — ABNORMAL HIGH (ref 0.0–149.0)
VLDL: 40.8 mg/dL — ABNORMAL HIGH (ref 0.0–40.0)

## 2023-07-13 LAB — HEMOGLOBIN A1C: Hgb A1c MFr Bld: 7.4 % — ABNORMAL HIGH (ref 4.6–6.5)

## 2023-07-13 LAB — TSH: TSH: 3.82 u[IU]/mL (ref 0.35–5.50)

## 2023-07-13 MED ORDER — SEMAGLUTIDE (1 MG/DOSE) 4 MG/3ML ~~LOC~~ SOPN: 1.0000 mg | PEN_INJECTOR | SUBCUTANEOUS | 1 refills | Status: DC

## 2023-07-13 MED ORDER — SULFAMETHOXAZOLE-TRIMETHOPRIM 800-160 MG PO TABS: 1.0000 | ORAL_TABLET | Freq: Two times a day (BID) | ORAL | 0 refills | Status: DC

## 2023-07-13 NOTE — Assessment & Plan Note (Signed)
Chronic problem.  On Trulicity 3mg  weekly but is having a difficult time finding medication and is going without it.  Eye exam scheduled.  UTD on microalbumin.  Foot exam done today.  Will switch from Trulicity to the more readily available Ozempic.  Pt expressed understanding and is in agreement w/ plan.

## 2023-07-13 NOTE — Patient Instructions (Signed)
Schedule your complete physical in 6 months We'll notify you of your lab results and make any changes if needed STOP the Trulicity START the Ozempic weekly Continue to work on healthy diet and regular exercise- you're doing great! Have your eye doctor send me a copy of their report TAKE the Bactrim for the UTI Drink LOTS of fluids Call with any questions or concerns Stay Safe!  Stay Healthy! Happy Fall!!!

## 2023-07-13 NOTE — Assessment & Plan Note (Signed)
Chronic problem.  Currently well controlled on Lisinopril 20mg  daily.  Asymptomatic.  Check labs due to ACE use but no anticipated med changes.  Will follow.

## 2023-07-13 NOTE — Assessment & Plan Note (Signed)
Chronic problem.  Currently on Lipitor 40mg  daily w/o difficulty.  Check labs.  Adjust meds prn

## 2023-07-13 NOTE — Progress Notes (Signed)
Subjective:    Patient ID: Catherine Castillo, female    DOB: Jan 02, 1961, 62 y.o.   MRN: [id number]  HPI DM- chronic problem, on Trulicity 3mg  weekly but having a difficult time finding this in stock.  Pt reports she has gone without it 'a lot'.  Pt has eye exam scheduled.  Due for foot exam.  Denies symptomatic lows.  Some episodic numbness/tingling of feet.  HTN- chronic problem, on Lisinopril 20mg  daily w/ good control.  No CP, SOB, HA's, visual changes  Hyperlipidemia- chronic problem, on Lipitor 40mg  daily.  No abd pain, N/V.  Dysuria- sxs started a few days ago w/ burning, frequency, urgency.  No fevers.  No suprapubic pain or CVA tenderness.   Review of Systems For ROS see HPI     Objective:   Physical Exam Vitals reviewed.  Constitutional:      General: She is not in acute distress.    Appearance: Normal appearance. She is well-developed. She is not ill-appearing.  HENT:     Head: Normocephalic and atraumatic.  Eyes:     Conjunctiva/sclera: Conjunctivae normal.     Pupils: Pupils are equal, round, and reactive to light.  Neck:     Thyroid: No thyromegaly.  Cardiovascular:     Rate and Rhythm: Normal rate and regular rhythm.     Pulses: Normal pulses.     Heart sounds: Normal heart sounds. No murmur heard. Pulmonary:     Effort: Pulmonary effort is normal. No respiratory distress.     Breath sounds: Normal breath sounds.  Abdominal:     General: There is no distension.     Palpations: Abdomen is soft.     Tenderness: There is no abdominal tenderness.  Musculoskeletal:     Cervical back: Normal range of motion and neck supple.     Right lower leg: No edema.     Left lower leg: No edema.  Lymphadenopathy:     Cervical: No cervical adenopathy.  Skin:    General: Skin is warm and dry.  Neurological:     General: No focal deficit present.     Mental Status: She is alert and oriented to person, place, and time.  Psychiatric:        Mood and Affect: Mood normal.         Behavior: Behavior normal.        Thought Content: Thought content normal.           Assessment & Plan:   Dysuria- new.  Pt's sxs and UA suspicious for infxn.  Start Bactrim due to PCN allergy.  Reviewed supportive care and red flags that should prompt return.  Pt expressed understanding and is in agreement w/ plan.

## 2023-07-14 ENCOUNTER — Telehealth: Payer: Self-pay

## 2023-07-14 NOTE — Telephone Encounter (Signed)
-----   Message from Neena Rhymes sent at 07/14/2023  7:36 AM EDT ----- A1C of 7.4% is pretty good considering how often you've had to go without your medication!  Total cholesterol, LDL (bad cholesterol) and triglycerides (fatty part of blood) are all up while your HDL (good cholesterol) is down.  This is probably due to all your travel and wedding celebrations and will improve w/ healthy diet and continued exercise.  Keep taking the Atorvastatin daily.  No changes at this time.  Remainder of labs look good!

## 2023-07-15 LAB — URINE CULTURE
MICRO NUMBER:: 15521343
SPECIMEN QUALITY:: ADEQUATE

## 2023-07-17 ENCOUNTER — Encounter: Payer: Self-pay | Admitting: Family Medicine

## 2023-07-17 ENCOUNTER — Telehealth: Payer: Self-pay

## 2023-07-17 MED ORDER — SULFAMETHOXAZOLE-TRIMETHOPRIM 800-160 MG PO TABS: 1.0000 | ORAL_TABLET | Freq: Two times a day (BID) | ORAL | 0 refills | Status: DC

## 2023-07-17 NOTE — Addendum Note (Signed)
Addended by: Sheliah Hatch on: 07/17/2023 01:50 PM   Modules accepted: Orders

## 2023-07-17 NOTE — Telephone Encounter (Signed)
-----   Message from Neena Rhymes sent at 07/17/2023  7:17 AM EDT ----- Your UTI is being treated appropriately w/ Bactrim.  Hope you are feeling better!

## 2023-07-26 ENCOUNTER — Other Ambulatory Visit: Payer: Self-pay | Admitting: Family Medicine

## 2023-07-28 ENCOUNTER — Encounter: Payer: Self-pay | Admitting: Family Medicine

## 2023-07-28 ENCOUNTER — Other Ambulatory Visit: Payer: Self-pay

## 2023-07-28 MED ORDER — JANUMET 50-500 MG PO TABS: 1.0000 | ORAL_TABLET | Freq: Two times a day (BID) | ORAL | 0 refills | Status: DC

## 2023-07-28 NOTE — Telephone Encounter (Signed)
07/13/23 pt reported she was not taking this med. Please advise if refill should be sent as request

## 2023-08-19 ENCOUNTER — Other Ambulatory Visit: Payer: Self-pay | Admitting: Family Medicine

## 2023-09-06 ENCOUNTER — Other Ambulatory Visit: Payer: Self-pay | Admitting: Family Medicine

## 2023-10-16 ENCOUNTER — Encounter: Payer: Self-pay | Admitting: Family Medicine

## 2023-10-16 MED ORDER — METFORMIN HCL ER 750 MG PO TB24: 750.0000 mg | ORAL_TABLET | Freq: Every day | ORAL | 1 refills | Status: DC

## 2023-10-16 MED ORDER — LEVOTHYROXINE SODIUM 150 MCG PO TABS: 150.0000 ug | ORAL_TABLET | Freq: Every day | ORAL | 1 refills | Status: DC

## 2023-10-22 ENCOUNTER — Other Ambulatory Visit: Payer: Self-pay | Admitting: Family Medicine

## 2023-11-16 ENCOUNTER — Other Ambulatory Visit: Payer: Self-pay | Admitting: Family Medicine

## 2023-11-24 ENCOUNTER — Encounter: Payer: Self-pay | Admitting: Family Medicine

## 2023-11-24 DIAGNOSIS — N390 Urinary tract infection, site not specified: Secondary | ICD-10-CM

## 2023-12-05 LAB — HM DIABETES EYE EXAM

## 2023-12-07 ENCOUNTER — Telehealth: Payer: Self-pay | Admitting: Family Medicine

## 2023-12-07 NOTE — Telephone Encounter (Signed)
Type of form received:DM Eye Exam   Additional comments:   Received IO:[id number]- Front Desk   Form should be Faxed/mailed to: (address/ fax #)  Is patient requesting call for pickup:N/A  Form placed: DM Eye Bin on the Left  Attach charge sheet.  Provider will determine charge.N/A  Individual made aware of 3-5 business day turn around No?

## 2023-12-08 ENCOUNTER — Encounter: Payer: Self-pay | Admitting: Family Medicine

## 2023-12-10 ENCOUNTER — Encounter: Payer: Self-pay | Admitting: Family Medicine

## 2023-12-12 ENCOUNTER — Encounter: Payer: Self-pay | Admitting: Family Medicine

## 2023-12-12 MED ORDER — LEVOTHYROXINE SODIUM 150 MCG PO TABS: 150.0000 ug | ORAL_TABLET | Freq: Every day | ORAL | 1 refills | Status: DC

## 2023-12-12 MED ORDER — METFORMIN HCL ER 750 MG PO TB24: 750.0000 mg | ORAL_TABLET | Freq: Every day | ORAL | 1 refills | Status: DC

## 2023-12-12 NOTE — Addendum Note (Signed)
 Addended by: Eldred Manges on: 12/12/2023 12:18 PM   Modules accepted: Orders

## 2023-12-13 MED ORDER — METFORMIN HCL ER 750 MG PO TB24: 750.0000 mg | ORAL_TABLET | Freq: Every day | ORAL | 1 refills | Status: DC

## 2023-12-13 MED ORDER — LEVOTHYROXINE SODIUM 150 MCG PO TABS: 150.0000 ug | ORAL_TABLET | Freq: Every day | ORAL | 1 refills | Status: DC

## 2023-12-23 ENCOUNTER — Other Ambulatory Visit: Payer: Self-pay | Admitting: Family Medicine

## 2024-01-12 ENCOUNTER — Encounter: Payer: 59 | Admitting: Family Medicine

## 2024-02-14 ENCOUNTER — Other Ambulatory Visit: Payer: Self-pay | Admitting: Family Medicine

## 2024-02-15 ENCOUNTER — Other Ambulatory Visit: Payer: Self-pay | Admitting: Family Medicine

## 2024-03-11 ENCOUNTER — Other Ambulatory Visit: Payer: Self-pay | Admitting: Family Medicine

## 2024-03-20 DIAGNOSIS — R1111 Vomiting without nausea: Secondary | ICD-10-CM | POA: Insufficient documentation

## 2024-03-20 DIAGNOSIS — K5730 Diverticulosis of large intestine without perforation or abscess without bleeding: Secondary | ICD-10-CM | POA: Insufficient documentation

## 2024-03-21 ENCOUNTER — Ambulatory Visit: Admitting: Family Medicine

## 2024-03-21 ENCOUNTER — Encounter: Payer: Self-pay | Admitting: Family Medicine

## 2024-03-21 VITALS — BP 150/80 | HR 83 | Temp 97.8°F | Ht 68.0 in | Wt 206.0 lb

## 2024-03-21 DIAGNOSIS — Z7984 Long term (current) use of oral hypoglycemic drugs: Secondary | ICD-10-CM | POA: Diagnosis not present

## 2024-03-21 DIAGNOSIS — Z23 Encounter for immunization: Secondary | ICD-10-CM | POA: Diagnosis not present

## 2024-03-21 DIAGNOSIS — I10 Essential (primary) hypertension: Secondary | ICD-10-CM | POA: Diagnosis not present

## 2024-03-21 DIAGNOSIS — E119 Type 2 diabetes mellitus without complications: Secondary | ICD-10-CM

## 2024-03-21 DIAGNOSIS — Z0000 Encounter for general adult medical examination without abnormal findings: Secondary | ICD-10-CM | POA: Diagnosis not present

## 2024-03-21 LAB — BASIC METABOLIC PANEL WITH GFR
BUN: 17 mg/dL (ref 6–23)
CO2: 23 meq/L (ref 19–32)
Calcium: 10 mg/dL (ref 8.4–10.5)
Chloride: 100 meq/L (ref 96–112)
Creatinine, Ser: 0.62 mg/dL (ref 0.40–1.20)
GFR: 94.07 mL/min (ref >60.00)
Glucose, Bld: 163 mg/dL — ABNORMAL HIGH (ref 70–99)
Potassium: 4.3 meq/L (ref 3.5–5.1)
Sodium: 136 meq/L (ref 135–145)

## 2024-03-21 LAB — HEMOGLOBIN A1C: Hgb A1c MFr Bld: 8.2 % — ABNORMAL HIGH (ref 4.6–6.5)

## 2024-03-21 LAB — CBC WITH DIFFERENTIAL/PLATELET
Basophils Absolute: 0 10*3/uL (ref 0.0–0.1)
Basophils Relative: 0.6 % (ref 0.0–3.0)
Eosinophils Absolute: 0.2 10*3/uL (ref 0.0–0.7)
Eosinophils Relative: 2.9 % (ref 0.0–5.0)
HCT: 40 % (ref 36.0–46.0)
Hemoglobin: 13.6 g/dL (ref 12.0–15.0)
Lymphocytes Relative: 31.1 % (ref 12.0–46.0)
Lymphs Abs: 2 10*3/uL (ref 0.7–4.0)
MCHC: 34 g/dL (ref 30.0–36.0)
MCV: 87.7 fl (ref 78.0–100.0)
Monocytes Absolute: 0.5 10*3/uL (ref 0.1–1.0)
Monocytes Relative: 7.7 % (ref 3.0–12.0)
Neutro Abs: 3.7 10*3/uL (ref 1.4–7.7)
Neutrophils Relative %: 57.7 % (ref 43.0–77.0)
Platelets: 317 10*3/uL (ref 150.0–400.0)
RBC: 4.56 Mil/uL (ref 3.87–5.11)
RDW: 13.4 % (ref 11.5–15.5)
WBC: 6.4 10*3/uL (ref 4.0–10.5)

## 2024-03-21 LAB — HEPATIC FUNCTION PANEL
ALT: 25 U/L (ref 0–35)
AST: 23 U/L (ref 0–37)
Albumin: 4.6 g/dL (ref 3.5–5.2)
Alkaline Phosphatase: 88 U/L (ref 39–117)
Bilirubin, Direct: 0.1 mg/dL (ref 0.0–0.3)
Total Bilirubin: 0.7 mg/dL (ref 0.2–1.2)
Total Protein: 7.9 g/dL (ref 6.0–8.3)

## 2024-03-21 LAB — LIPID PANEL
Cholesterol: 201 mg/dL — ABNORMAL HIGH (ref 0–200)
HDL: 37.4 mg/dL — ABNORMAL LOW (ref >39.00)
LDL Cholesterol: 105 mg/dL — ABNORMAL HIGH (ref 0–99)
NonHDL: 163.36
Total CHOL/HDL Ratio: 5
Triglycerides: 294 mg/dL — ABNORMAL HIGH (ref 0.0–149.0)
VLDL: 58.8 mg/dL — ABNORMAL HIGH (ref 0.0–40.0)

## 2024-03-21 LAB — MICROALBUMIN / CREATININE URINE RATIO
Creatinine,U: 154.9 mg/dL
Microalb Creat Ratio: 13.4 mg/g (ref 0.0–30.0)
Microalb, Ur: 2.1 mg/dL — ABNORMAL HIGH (ref 0.0–1.9)

## 2024-03-21 LAB — TSH: TSH: 1.74 u[IU]/mL (ref 0.35–5.50)

## 2024-03-21 MED ORDER — AMLODIPINE BESYLATE 5 MG PO TABS: 5.0000 mg | ORAL_TABLET | Freq: Every day | ORAL | 1 refills | Status: DC

## 2024-03-21 NOTE — Addendum Note (Signed)
 Addended by: Larissa Plowman on: 03/21/2024 08:17 AM   Modules accepted: Orders

## 2024-03-21 NOTE — Assessment & Plan Note (Signed)
 Pt's PE WNL w/ exception of BMI.  UTD on pap, mammo, colonoscopy, Tdap.  PNA given today.  Check labs.  Anticipatory guidance provided.

## 2024-03-21 NOTE — Patient Instructions (Signed)
 Follow up in 4-6 weeks to recheck blood pressure We'll notify you of your lab results and make any changes if needed ADD the Amlodipine daily CONTINUE the Lisinopril  daily Keep up the good work on healthy diet and regular exercise- you're doing great!! Call with any questions or concerns Stay Safe!  Stay Healthy! Have a great weekend!!!

## 2024-03-21 NOTE — Progress Notes (Signed)
   Subjective:    Patient ID: Catherine Castillo, female    DOB: Nov 03, 1960, 63 y.o.   MRN: [id number]  HPI CPE- UTD on mammo, foot exam, eye exam, Tdap, pap, colonoscopy.  Patient Care Team    Relationship Specialty Notifications Start End  Jess Morita, MD PCP - General   09/22/10   Princella Brooklyn, OD Consulting Physician Optometry  02/06/15   Laine Piggs, NP Nurse Practitioner Radiology  08/16/22     Health Maintenance  Topic Date Due   Pneumococcal Vaccine 25-32 Years old (2 of 2 - PCV) 08/07/2015   Zoster Vaccines- Shingrix (2 of 2) 04/20/2022   COVID-19 Vaccine (5 - 2024-25 season) 06/18/2023   Diabetic kidney evaluation - Urine ACR  09/20/2023   HEMOGLOBIN A1C  01/10/2024   MAMMOGRAM  05/10/2024   INFLUENZA VACCINE  05/17/2024   Diabetic kidney evaluation - eGFR measurement  07/12/2024   FOOT EXAM  07/12/2024   OPHTHALMOLOGY EXAM  12/04/2024   DTaP/Tdap/Td (3 - Td or Tdap) 04/18/2025   Cervical Cancer Screening (HPV/Pap Cotest)  08/24/2027   Colonoscopy  09/14/2027   Hepatitis C Screening  Completed   HPV VACCINES  Aged Out   Meningococcal B Vaccine  Aged Out   HIV Screening  Discontinued      Review of Systems Patient reports no vision/ hearing changes, adenopathy,fever, weight change,  persistant/recurrent hoarseness , swallowing issues, chest pain, palpitations, edema, persistant/recurrent cough, hemoptysis, dyspnea (rest/exertional/paroxysmal nocturnal), gastrointestinal bleeding (melena, rectal bleeding), abdominal pain, significant heartburn, bowel changes, GU symptoms (dysuria, hematuria, incontinence), Gyn symptoms (abnormal  bleeding, pain),  syncope, focal weakness, memory loss, numbness & tingling, skin/hair/nail changes, abnormal bruising or bleeding, anxiety, or depression.     Objective:   Physical Exam General Appearance:    Alert, cooperative, no distress, appears stated age  Head:    Normocephalic, without obvious abnormality, atraumatic   Eyes:    PERRL, conjunctiva/corneas clear, EOM's intact both eyes  Ears:    Normal TM's and external ear canals, both ears  Nose:   Nares normal, septum midline, mucosa normal, no drainage    or sinus tenderness  Throat:   Lips, mucosa, and tongue normal; teeth and gums normal  Neck:   Supple, symmetrical, trachea midline, no adenopathy;    Thyroid : no enlargement/tenderness/nodules  Back:     Symmetric, no curvature, ROM normal, no CVA tenderness  Lungs:     Clear to auscultation bilaterally, respirations unlabored  Chest Wall:    No tenderness or deformity   Heart:    Regular rate and rhythm, S1 and S2 normal, no murmur, rub   or gallop  Breast Exam:    Deferred to mammo  Abdomen:     Soft, non-tender, bowel sounds active all four quadrants,    no masses, no organomegaly  Genitalia:    Deferred  Rectal:    Extremities:   Extremities normal, atraumatic, no cyanosis or edema  Pulses:   2+ and symmetric all extremities  Skin:   Skin color, texture, turgor normal, no rashes or lesions  Lymph nodes:   Cervical, supraclavicular, and axillary nodes normal  Neurologic:   CNII-XII intact, normal strength, sensation and reflexes    throughout          Assessment & Plan:

## 2024-03-21 NOTE — Assessment & Plan Note (Signed)
 Deteriorated.  BP is elevated today.  Pt reports this has been occurring at various times and she can feel her BP go up.  When she checks it at home or work, BP is elevated when she feels like it is.  Will add Amlodipine and monitor closely.

## 2024-03-24 ENCOUNTER — Ambulatory Visit: Payer: Self-pay | Admitting: Family Medicine

## 2024-03-24 MED ORDER — SEMAGLUTIDE (2 MG/DOSE) 8 MG/3ML ~~LOC~~ SOPN: 2.0000 mg | PEN_INJECTOR | SUBCUTANEOUS | 3 refills | Status: DC

## 2024-04-18 ENCOUNTER — Telehealth: Payer: Self-pay | Admitting: Family Medicine

## 2024-04-18 NOTE — Telephone Encounter (Signed)
 error

## 2024-04-25 ENCOUNTER — Ambulatory Visit: Admitting: Family Medicine

## 2024-04-26 ENCOUNTER — Ambulatory Visit: Admitting: Family Medicine

## 2024-05-04 ENCOUNTER — Other Ambulatory Visit: Payer: Self-pay | Admitting: Family Medicine

## 2024-05-08 ENCOUNTER — Other Ambulatory Visit: Payer: Self-pay | Admitting: Family Medicine

## 2024-05-08 NOTE — Telephone Encounter (Signed)
 Sent MyChart message for patient to call and make a follow up appointment with Dr. Mahlon for a med check/labs.

## 2024-05-16 LAB — HM MAMMOGRAPHY

## 2024-05-20 ENCOUNTER — Encounter: Payer: Self-pay | Admitting: Family Medicine

## 2024-06-08 ENCOUNTER — Other Ambulatory Visit: Payer: Self-pay | Admitting: Family Medicine

## 2024-07-05 ENCOUNTER — Other Ambulatory Visit: Payer: Self-pay | Admitting: Family Medicine

## 2024-07-17 ENCOUNTER — Other Ambulatory Visit: Payer: Self-pay | Admitting: Family Medicine

## 2024-08-03 ENCOUNTER — Other Ambulatory Visit: Payer: Self-pay | Admitting: Family Medicine

## 2024-08-06 ENCOUNTER — Other Ambulatory Visit: Payer: Self-pay | Admitting: Family Medicine

## 2024-08-31 ENCOUNTER — Other Ambulatory Visit: Payer: Self-pay | Admitting: Family Medicine

## 2024-09-14 ENCOUNTER — Encounter: Payer: Self-pay | Admitting: Family Medicine

## 2024-09-14 ENCOUNTER — Other Ambulatory Visit: Payer: Self-pay | Admitting: Family Medicine

## 2024-09-17 NOTE — Telephone Encounter (Signed)
Medication was sent in by other means

## 2024-09-19 ENCOUNTER — Ambulatory Visit: Admitting: Family Medicine

## 2024-10-01 ENCOUNTER — Other Ambulatory Visit (HOSPITAL_COMMUNITY): Payer: Self-pay

## 2024-10-01 ENCOUNTER — Telehealth: Payer: Self-pay

## 2024-10-01 ENCOUNTER — Other Ambulatory Visit: Payer: Self-pay | Admitting: Family Medicine

## 2024-10-01 NOTE — Telephone Encounter (Signed)
 Pharmacy Patient Advocate Encounter   Received notification from Onbase that prior authorization for Ozempic  (2 MG/DOSE) 8MG /3ML pen-injectors   is required/requested.   Insurance verification completed.   The patient is insured through [facility].   Per test claim: PA required; PA submitted to above mentioned insurance via Latent Key/confirmation #/EOC [id number] Status is pending

## 2024-10-02 ENCOUNTER — Other Ambulatory Visit (HOSPITAL_COMMUNITY): Payer: Self-pay

## 2024-10-02 NOTE — Telephone Encounter (Signed)
 Called patient to relay approval, left detailed message on Vm per DPR

## 2024-10-02 NOTE — Telephone Encounter (Signed)
 Pharmacy Patient Advocate Encounter  Received notification from [employer] that Prior Authorization for Ozempic  (2 MG/DOSE) 8MG /3ML pen-injectors  has been APPROVED from 12/16/225 to 09/30/2025. Unable to obtain price due to refill too soon rejection, last fill date 10/01/2024 next available fill date01/03/2025   PA #/Case ID/Reference #: [id number]

## 2024-10-11 ENCOUNTER — Other Ambulatory Visit: Payer: Self-pay | Admitting: Family Medicine

## 2024-10-14 ENCOUNTER — Other Ambulatory Visit: Payer: Self-pay

## 2024-10-14 DIAGNOSIS — E119 Type 2 diabetes mellitus without complications: Secondary | ICD-10-CM

## 2024-10-14 MED ORDER — METFORMIN HCL ER 750 MG PO TB24: 750.0000 mg | ORAL_TABLET | Freq: Every day | ORAL | 0 refills | Status: DC

## 2024-10-22 ENCOUNTER — Encounter: Payer: Self-pay | Admitting: Family Medicine

## 2024-10-22 DIAGNOSIS — E119 Type 2 diabetes mellitus without complications: Secondary | ICD-10-CM

## 2024-10-23 MED ORDER — METFORMIN HCL ER 750 MG PO TB24: 750.0000 mg | ORAL_TABLET | Freq: Every day | ORAL | 0 refills | Status: AC

## 2024-11-01 ENCOUNTER — Ambulatory Visit: Admitting: Family Medicine

## 2024-11-08 ENCOUNTER — Ambulatory Visit: Admitting: Family Medicine

## 2024-11-15 ENCOUNTER — Ambulatory Visit: Admitting: Family Medicine

## 2024-11-16 ENCOUNTER — Other Ambulatory Visit: Payer: Self-pay | Admitting: Family Medicine

## 2024-11-18 NOTE — Telephone Encounter (Addendum)
 Requested Prescriptions   Pending Prescriptions Disp Refills   OZEMPIC , 2 MG/DOSE, 8 MG/3ML SOPN [Pharmacy Med Name: OZEMPIC   2MG  SOLUTION PEN-INJECTOR  PEN DOSE] 6 mL 5    Sig: INJECT SUBCUTANEOUSLY 2 MG EVERY WEEK AS DIRECTED     Date of patient request: 11/18/2024 Last office visit: 03/21/2024 Upcoming visit: 11/22/2024 Date of last refill: 10/01/2024 Last refill amount: 3mL  Patient requesting 1 year supply

## 2024-11-22 ENCOUNTER — Encounter: Payer: Self-pay | Admitting: Family Medicine

## 2024-11-22 ENCOUNTER — Ambulatory Visit: Admitting: Family Medicine

## 2024-11-22 VITALS — BP 132/84 | HR 90 | Ht 68.0 in | Wt 200.6 lb

## 2024-11-22 DIAGNOSIS — E1169 Type 2 diabetes mellitus with other specified complication: Secondary | ICD-10-CM

## 2024-11-22 DIAGNOSIS — E119 Type 2 diabetes mellitus without complications: Secondary | ICD-10-CM

## 2024-11-22 DIAGNOSIS — I10 Essential (primary) hypertension: Secondary | ICD-10-CM

## 2024-11-22 LAB — HEPATIC FUNCTION PANEL
ALT: 20 U/L (ref 3–35)
AST: 13 U/L (ref 5–37)
Albumin: 4.3 g/dL (ref 3.5–5.2)
Alkaline Phosphatase: 105 U/L (ref 39–117)
Bilirubin, Direct: 0.1 mg/dL (ref 0.1–0.3)
Total Bilirubin: 0.7 mg/dL (ref 0.2–1.2)
Total Protein: 7.2 g/dL (ref 6.0–8.3)

## 2024-11-22 LAB — CBC WITH DIFFERENTIAL/PLATELET
Basophils Absolute: 0 10*3/uL (ref 0.0–0.1)
Basophils Relative: 0.4 % (ref 0.0–3.0)
Eosinophils Absolute: 0.2 10*3/uL (ref 0.0–0.7)
Eosinophils Relative: 3.7 % (ref 0.0–5.0)
HCT: 39.2 % (ref 36.0–46.0)
Hemoglobin: 13.2 g/dL (ref 12.0–15.0)
Lymphocytes Relative: 25.2 % (ref 12.0–46.0)
Lymphs Abs: 1.7 10*3/uL (ref 0.7–4.0)
MCHC: 33.8 g/dL (ref 30.0–36.0)
MCV: 87.3 fl (ref 78.0–100.0)
Monocytes Absolute: 0.6 10*3/uL (ref 0.1–1.0)
Monocytes Relative: 8.3 % (ref 3.0–12.0)
Neutro Abs: 4.2 10*3/uL (ref 1.4–7.7)
Neutrophils Relative %: 62.4 % (ref 43.0–77.0)
Platelets: 356 10*3/uL (ref 150.0–400.0)
RBC: 4.49 Mil/uL (ref 3.87–5.11)
RDW: 13.5 % (ref 11.5–15.5)
WBC: 6.8 10*3/uL (ref 4.0–10.5)

## 2024-11-22 LAB — TSH: TSH: 0.84 u[IU]/mL (ref 0.35–5.50)

## 2024-11-22 LAB — LIPID PANEL
Cholesterol: 178 mg/dL (ref 28–200)
HDL: 35.4 mg/dL — ABNORMAL LOW
LDL Cholesterol: 77 mg/dL (ref 10–99)
NonHDL: 142.4
Total CHOL/HDL Ratio: 5
Triglycerides: 328 mg/dL — ABNORMAL HIGH (ref 10.0–149.0)
VLDL: 65.6 mg/dL — ABNORMAL HIGH (ref 0.0–40.0)

## 2024-11-22 LAB — BASIC METABOLIC PANEL WITH GFR
BUN: 17 mg/dL (ref 6–23)
CO2: 30 meq/L (ref 19–32)
Calcium: 9.7 mg/dL (ref 8.4–10.5)
Chloride: 99 meq/L (ref 96–112)
Creatinine, Ser: 0.56 mg/dL (ref 0.40–1.20)
GFR: 95.95 mL/min
Glucose, Bld: 230 mg/dL — ABNORMAL HIGH (ref 70–99)
Potassium: 4.3 meq/L (ref 3.5–5.1)
Sodium: 137 meq/L (ref 135–145)

## 2024-11-22 LAB — HEMOGLOBIN A1C: Hgb A1c MFr Bld: 8.8 % — ABNORMAL HIGH (ref 4.6–6.5)

## 2024-11-22 MED ORDER — LEVOTHYROXINE SODIUM 150 MCG PO TABS: 150.0000 ug | ORAL_TABLET | Freq: Every day | ORAL | 1 refills | Status: AC

## 2024-11-22 MED ORDER — AMLODIPINE BESYLATE 5 MG PO TABS: 5.0000 mg | ORAL_TABLET | Freq: Every day | ORAL | 1 refills | Status: AC

## 2024-11-22 MED ORDER — ATORVASTATIN CALCIUM 40 MG PO TABS: 40.0000 mg | ORAL_TABLET | Freq: Every day | ORAL | 1 refills | Status: AC

## 2024-11-22 MED ORDER — LISINOPRIL 20 MG PO TABS: 20.0000 mg | ORAL_TABLET | Freq: Every day | ORAL | 1 refills | Status: AC

## 2024-11-22 NOTE — Assessment & Plan Note (Signed)
 Chronic problem.  On Lipitor 40mg  daily w/o difficulty.  Check labs.  Adjust meds prn  ?

## 2024-11-22 NOTE — Assessment & Plan Note (Signed)
 Chronic problem.  Currently on Ozempic  2mg  weekly and Metformin  XR 750mg  daily.  UTD on eye exam, microalbumin.  Foot exam done today.  Applauded her weight loss.  Currently asymptomatic.  Check labs.  Adjust meds prn

## 2024-11-22 NOTE — Patient Instructions (Signed)
 Schedule your complete physical in 4 months We'll notify you of your lab results and make any changes if needed Continue to work on healthy diet and regular exercise- you're doing great! Call with any questions or concerns Stay Safe!  Stay Healthy! Happy Valentine's Day!

## 2024-11-22 NOTE — Progress Notes (Signed)
" ° °  Subjective:    Patient ID: Catherine Castillo, female    DOB: May 18, 1961, 64 y.o.   MRN: [id number]  HPI DM- chronic problem, on Ozempic  2mg  weekly, Metformin  XR 750mg  daily.  UTD on eye exam, microalbumin.  Due for foot exam.  Down 6 lbs since last visit.  No numbness/tingling of hands/feet.  No sores or blisters on feet.  HTN- chronic problem, on Amlodipine  5mg  daily, Lisinopril  20mg  daily.  Denies CP, SOB, HA's, visual changes, edema.  Hyperlipidemia- chronic problem, on Lipitor 40mg  daily.  Denies abd pain, N/V.   Review of Systems For ROS see HPI     Objective:   Physical Exam Vitals reviewed.  Constitutional:      General: She is not in acute distress.    Appearance: Normal appearance. She is well-developed. She is not ill-appearing.  HENT:     Head: Normocephalic and atraumatic.  Eyes:     Conjunctiva/sclera: Conjunctivae normal.     Pupils: Pupils are equal, round, and reactive to light.  Neck:     Thyroid : No thyromegaly.  Cardiovascular:     Rate and Rhythm: Normal rate and regular rhythm.     Pulses: Normal pulses.     Heart sounds: Normal heart sounds. No murmur heard. Pulmonary:     Effort: Pulmonary effort is normal. No respiratory distress.     Breath sounds: Normal breath sounds.  Abdominal:     General: There is no distension.     Palpations: Abdomen is soft.     Tenderness: There is no abdominal tenderness.  Musculoskeletal:     Cervical back: Normal range of motion and neck supple.     Right lower leg: No edema.     Left lower leg: No edema.  Lymphadenopathy:     Cervical: No cervical adenopathy.  Skin:    General: Skin is warm and dry.  Neurological:     General: No focal deficit present.     Mental Status: She is alert and oriented to person, place, and time.  Psychiatric:        Mood and Affect: Mood normal.        Behavior: Behavior normal.        Thought Content: Thought content normal.           Assessment & Plan:    "

## 2024-11-22 NOTE — Assessment & Plan Note (Signed)
 Chronic problem.  Currently adequate control on Amlodipine  and Lisinopril .  Asymptomatic.  Check labs due to ACE but no anticipated med changes.

## 2024-11-25 ENCOUNTER — Ambulatory Visit: Admitting: Nurse Practitioner

## 2025-01-13 ENCOUNTER — Encounter: Payer: Self-pay | Admitting: Radiology

## 2025-04-04 ENCOUNTER — Encounter: Admitting: Family Medicine
